# Patient Record
Sex: Female | Born: 1985 | Race: White | Hispanic: No | Marital: Married | Smoking: Never smoker
Health system: Southern US, Community
[De-identification: ages and names within clinical notes are randomized; demographics above are authoritative.]

## PROBLEM LIST (undated history)

## (undated) ENCOUNTER — Inpatient Hospital Stay: Payer: Self-pay

## (undated) DIAGNOSIS — E039 Hypothyroidism, unspecified: Secondary | ICD-10-CM

## (undated) DIAGNOSIS — K8020 Calculus of gallbladder without cholecystitis without obstruction: Secondary | ICD-10-CM

## (undated) DIAGNOSIS — N83202 Unspecified ovarian cyst, left side: Secondary | ICD-10-CM

## (undated) DIAGNOSIS — O039 Complete or unspecified spontaneous abortion without complication: Secondary | ICD-10-CM

## (undated) DIAGNOSIS — F32A Depression, unspecified: Secondary | ICD-10-CM

## (undated) DIAGNOSIS — Z6841 Body Mass Index (BMI) 40.0 and over, adult: Secondary | ICD-10-CM

## (undated) DIAGNOSIS — D649 Anemia, unspecified: Secondary | ICD-10-CM

## (undated) DIAGNOSIS — F419 Anxiety disorder, unspecified: Secondary | ICD-10-CM

## (undated) DIAGNOSIS — B002 Herpesviral gingivostomatitis and pharyngotonsillitis: Secondary | ICD-10-CM

## (undated) DIAGNOSIS — F329 Major depressive disorder, single episode, unspecified: Secondary | ICD-10-CM

## (undated) HISTORY — DX: Herpesviral gingivostomatitis and pharyngotonsillitis: B00.2

## (undated) HISTORY — DX: Unspecified ovarian cyst, left side: N83.202

---

## 2006-04-06 ENCOUNTER — Emergency Department: Payer: Self-pay | Admitting: Emergency Medicine

## 2006-10-10 ENCOUNTER — Emergency Department: Payer: Self-pay | Admitting: Emergency Medicine

## 2008-12-09 ENCOUNTER — Emergency Department: Payer: Self-pay | Admitting: Emergency Medicine

## 2009-01-03 ENCOUNTER — Ambulatory Visit: Payer: Self-pay

## 2009-01-11 ENCOUNTER — Encounter: Payer: Self-pay | Admitting: Family Medicine

## 2009-02-04 ENCOUNTER — Encounter: Payer: Self-pay | Admitting: Family Medicine

## 2009-03-07 ENCOUNTER — Encounter: Payer: Self-pay | Admitting: Family Medicine

## 2009-03-27 ENCOUNTER — Ambulatory Visit: Payer: Self-pay | Admitting: Family Medicine

## 2009-05-04 ENCOUNTER — Encounter: Payer: Self-pay | Admitting: Family Medicine

## 2009-08-22 ENCOUNTER — Emergency Department: Payer: Self-pay | Admitting: Emergency Medicine

## 2010-07-15 ENCOUNTER — Emergency Department: Payer: Self-pay | Admitting: Internal Medicine
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
## 2012-06-06 ENCOUNTER — Ambulatory Visit: Payer: Self-pay | Admitting: Primary Care

## 2013-09-09 DIAGNOSIS — N644 Mastodynia: Secondary | ICD-10-CM | POA: Insufficient documentation

## 2014-06-07 LAB — HM PAP SMEAR: HM Pap smear: NEGATIVE

## 2014-10-28 ENCOUNTER — Emergency Department: Payer: Medicare Other

## 2014-10-28 ENCOUNTER — Encounter: Payer: Self-pay | Admitting: *Deleted

## 2014-10-28 ENCOUNTER — Emergency Department
Admission: EM | Admit: 2014-10-28 | Discharge: 2014-10-28 | Disposition: A | Discharge status: Home / Self Care | Payer: Medicare Other | Attending: Emergency Medicine | Admitting: Emergency Medicine

## 2014-10-28 DIAGNOSIS — O2342 Unspecified infection of urinary tract in pregnancy, second trimester: Secondary | ICD-10-CM | POA: Insufficient documentation

## 2014-10-28 DIAGNOSIS — R109 Unspecified abdominal pain: Secondary | ICD-10-CM

## 2014-10-28 DIAGNOSIS — O9989 Other specified diseases and conditions complicating pregnancy, childbirth and the puerperium: Secondary | ICD-10-CM | POA: Diagnosis present

## 2014-10-28 DIAGNOSIS — Z3A17 17 weeks gestation of pregnancy: Secondary | ICD-10-CM | POA: Insufficient documentation

## 2014-10-28 DIAGNOSIS — Z79899 Other long term (current) drug therapy: Secondary | ICD-10-CM | POA: Insufficient documentation

## 2014-10-28 DIAGNOSIS — O2341 Unspecified infection of urinary tract in pregnancy, first trimester: Secondary | ICD-10-CM

## 2014-10-28 HISTORY — DX: Complete or unspecified spontaneous abortion without complication: O03.9

## 2014-10-28 LAB — URINALYSIS COMPLETE WITH MICROSCOPIC (ARMC ONLY)
BACTERIA UA: NONE SEEN
Bilirubin Urine: NEGATIVE
GLUCOSE, UA: NEGATIVE mg/dL
HGB URINE DIPSTICK: NEGATIVE
Ketones, ur: NEGATIVE mg/dL
Nitrite: NEGATIVE
Protein, ur: NEGATIVE mg/dL
SPECIFIC GRAVITY, URINE: 1.024 (ref 1.005–1.030)
pH: 5 (ref 5.0–8.0)

## 2014-10-28 LAB — BASIC METABOLIC PANEL
ANION GAP: 9 (ref 5–15)
BUN: 7 mg/dL (ref 6–20)
CHLORIDE: 105 mmol/L (ref 101–111)
CO2: 21 mmol/L — AB (ref 22–32)
CREATININE: 0.63 mg/dL (ref 0.44–1.00)
Calcium: 8.9 mg/dL (ref 8.9–10.3)
GFR calc Af Amer: >60 mL/min (ref >60)
GFR calc non Af Amer: >60 mL/min (ref >60)
Glucose, Bld: 106 mg/dL — ABNORMAL HIGH (ref 65–99)
Potassium: 3.5 mmol/L (ref 3.5–5.1)
Sodium: 135 mmol/L (ref 135–145)

## 2014-10-28 LAB — CBC
HCT: 37 % (ref 35.0–47.0)
Hemoglobin: 12 g/dL (ref 12.0–16.0)
MCH: 26.3 pg (ref 26.0–34.0)
MCHC: 32.3 g/dL (ref 32.0–36.0)
MCV: 81.4 fL (ref 80.0–100.0)
PLATELETS: 232 10*3/uL (ref 150–440)
RBC: 4.55 MIL/uL (ref 3.80–5.20)
RDW: 14.6 % — ABNORMAL HIGH (ref 11.5–14.5)
WBC: 14 10*3/uL — AB (ref 3.6–11.0)

## 2014-10-28 MED ORDER — NITROFURANTOIN MONOHYD MACRO 100 MG PO CAPS: 100.0000 mg | ORAL_CAPSULE | Freq: Two times a day (BID) | ORAL | Status: AC

## 2014-10-28 NOTE — Discharge Instructions (Signed)
Your ultrasound shows a pregnancy that is 7 weeks 6 days.  You do have some signs of possible urinary tract infection. Take Macrobid for this. Follow-up with [facility]. Return to the emergency department if you have any urgent concerns.  Abdominal Pain During Pregnancy Belly (abdominal) pain is common during pregnancy. Most of the time, it is not a serious problem. Other times, it can be a sign that something is wrong with the pregnancy. Always tell your doctor if you have belly pain. HOME CARE Monitor your belly pain for any changes. The following actions may help you feel better:  Do not have sex (intercourse) or put anything in your vagina until you feel better.  Rest until your pain stops.  Drink clear fluids if you feel sick to your stomach (nauseous). Do not eat solid food until you feel better.  Only take medicine as told by your doctor.  Keep all doctor visits as told. GET HELP RIGHT AWAY IF:   You are bleeding, leaking fluid, or pieces of tissue come out of your vagina.  You have more pain or cramping.  You keep throwing up (vomiting).  You have pain when you pee (urinate) or have blood in your pee.  You have a fever.  You do not feel your baby moving as much.  You feel very weak or feel like passing out.  You have trouble breathing, with or without belly pain.  You have a very bad headache and belly pain.  You have fluid leaking from your vagina and belly pain.  You keep having watery poop (diarrhea).  Your belly pain does not go away after resting, or the pain gets worse. MAKE SURE YOU:   Understand these instructions.  Will watch your condition.  Will get help right away if you are not doing well or get worse. Document Released: 04/11/2009 Document Revised: 12/24/2012 Document Reviewed: 11/20/2012 [facility] Patient Information 2015 [place], [place]. This information is not intended to replace advice given to you by your health care provider. Make  sure you discuss any questions you have with your health care provider.

## 2014-10-28 NOTE — ED Notes (Signed)
Pt is pregnant LMP 06/28/2014 and EDD 04/04/2015 (pt is about [redacted]weeks pregnant).  Pt has been having cramping today, they feel like menstrual cramps.  No bleeding or vaginal discharge. Pt with hx of miscarriage last year.

## 2014-10-28 NOTE — ED Provider Notes (Signed)
[facility] Emergency Department Provider Note  ____________________________________________  Time seen: 2023  I have reviewed the triage vital signs and the nursing notes.   HISTORY  Chief Complaint Abdominal Cramping  [redacted] weeks pregnant, cramping    HPI Alexis Weiss is a 29 y.o. female  who is G1 P0 A1 and now approximately [redacted] weeks pregnant. At 2:00 today she began to have some cramping in her lower abdomen/pelvic area. This lasted for 15 minutes.She does report that she sneezed after this event and had some pelvic pain 2.  Since then she has been feeling fine without cramping or pain. She has not had any vaginal secretions or fluid and she has not had any vaginal bleeding. She denies any nausea or vomiting. She is concerned about pregnancy due to her first pregnancy ending in miscarriage. That miscarriage was early in pregnancy and when it onset she did not even know she was pregnant.  The patient is receiving prenatal care at [facility] clinic. She has not had an ultrasound to date.    Past Medical History  Diagnosis Date  . Miscarriage     There are no active problems to display for this patient.   History reviewed. No pertinent past surgical history.  Current Outpatient Rx  Name  Route  Sig  Dispense  Refill  . nitrofurantoin, macrocrystal-monohydrate, (MACROBID) 100 MG capsule   Oral   Take 1 capsule (100 mg total) by mouth 2 (two) times daily.   14 capsule   0     Allergies Review of patient's allergies indicates no known allergies.  No family history on file.  Social History History  Substance Use Topics  . Smoking status: Never Smoker   . Smokeless tobacco: Not on file  . Alcohol Use: No    Review of Systems  Constitutional: Negative for fever. ENT: Negative for sore throat. Cardiovascular: Negative for chest pain. Respiratory: Negative for shortness of breath. Gastrointestinal: Negative for abdominal pain, vomiting  and diarrhea. Genitourinary: Pregnant, 17 weeks, see history of present illness. Musculoskeletal: No myalgias or injuries. Skin: Negative for rash. Neurological: Negative for headaches   10-point ROS otherwise negative.  ____________________________________________   PHYSICAL EXAM:  VITAL SIGNS: ED Triage Vitals  Enc Vitals Group     BP 10/28/14 1904 136/81 mmHg     Pulse Rate 10/28/14 1904 94     Resp 10/28/14 1904 22     Temp 10/28/14 1904 98.4 F (36.9 C)     Temp Source 10/28/14 1904 Oral     SpO2 10/28/14 1904 98 %     Weight 10/28/14 1904 247 lb (112.038 kg)     Height 10/28/14 1904 5\' 4"  (1.626 m)     Head Cir --      Peak Flow --      Pain Score 10/28/14 1904 0     Pain Loc --      Pain Edu? --      Excl. in GC? --     Constitutional: Alert and oriented. Well appearing and in no distress. ENT   Head: Normocephalic and atraumatic.   Nose: No congestion/rhinnorhea.   Mouth/Throat: Mucous membranes are moist. Cardiovascular: Normal rate, regular rhythm, no murmur noted Respiratory:  Normal respiratory effort, no tachypnea.    Breath sounds are clear and equal bilaterally.  Gastrointestinal: Soft and nontender. No distention.  Back: No muscle spasm, no tenderness, no CVA tenderness. Musculoskeletal: No deformity noted. Nontender with normal range of motion in  all extremities.  No noted edema. Neurologic:  Normal speech and language. No gross focal neurologic deficits are appreciated.  Skin:  Skin is warm, dry. No rash noted. Psychiatric: Mood and affect are normal. Speech and behavior are normal.  ____________________________________________    LABS (pertinent positives/negatives)  Urinalysis:  White blood cells 6-30, red blood cells negative, no ketones White blood cell count 14,000 Metabolic panel within normal limits. ____________________________________________ ____________________________________________    RADIOLOGY  Bedside ultrasound  performed by me, Janalyn Harder.  Using a portable ultrasound machine we attempted to view of pregnancy that would be consistent with a 17 week chest a patient. There is content in the uterus, but not as developed as one would expect at 17 weeks and without notable fetal heart tones on our limited study. I will order a formal ultrasound due to the lack of confirmation via this limited study.  Pelvic ultrasound OB IMPRESSION: Single live intrauterine pregnancy noted, with a crown-rump length of 1.5 cm, corresponding to a gestational age of [redacted] weeks 6 days. This does not match the gestational age by LMP, reflecting a new estimated date of June 10, 2015.   ____________________________________________   PROCEDURES  Bedside ultrasound: Using a portable ultrasound machine we attempted to view of pregnancy that would be consistent with a 17 week chest a patient. There is content in the uterus, but not as developed as one would expect at 17 weeks and without notable fetal heart tones on our limited study. I will order a formal ultrasound due to the lack of confirmation via this limited study.  ____________________________________________   INITIAL IMPRESSION / ASSESSMENT AND PLAN / ED COURSE  Pertinent labs & imaging results that were available during my care of the patient were reviewed by me and considered in my medical decision making (see chart for details).  This patient appears well. She had very limited cramping that lasted 15 minutes over 6 hours ago. There is no indication for emergent ultrasound. We will confirm fetal heart tones with a bedside ultrasound.  The patient is appreciative this and is here to have a few of the developing pregnancy. She understands that this is a limited study without any comment on details and then it does not replace the significant ultrasound due at 18-19 weeks pregnancy.  ----------------------------------------- 11:01 PM on  10/28/2014 -----------------------------------------  Ultrasound is overall normal but with a gestational age of [redacted] weeks and 6 days not 17 weeks. Or blood cell count is 14,000 otherwise the CBC is reasonable. Metabolic panel is overall within normal limits. Urinalysis shows white blood cells 6-30 with 1+ leukocyte esterase. I will treat her with Macrobid for her possible urinary tract infection.  ____________________________________________   FINAL CLINICAL IMPRESSION(S) / ED DIAGNOSES  Final diagnoses:  UTI (urinary tract infection) in pregnancy in first trimester      Darien Ramus, MD 10/28/14 2305

## 2014-10-29 LAB — HCG, QUANTITATIVE, PREGNANCY: hCG, Beta Chain, Quant, S: 50046 m[IU]/mL — ABNORMAL HIGH (ref <5)

## 2014-11-10 LAB — OB RESULTS CONSOLE GC/CHLAMYDIA
CHLAMYDIA, DNA PROBE: NEGATIVE
GC PROBE AMP, GENITAL: NEGATIVE

## 2014-12-11 ENCOUNTER — Emergency Department
Admission: EM | Admit: 2014-12-11 | Discharge: 2014-12-11 | Disposition: A | Discharge status: Home / Self Care | Payer: Medicare Other | Attending: Emergency Medicine | Admitting: Emergency Medicine

## 2014-12-11 ENCOUNTER — Encounter: Payer: Self-pay | Admitting: Emergency Medicine

## 2014-12-11 DIAGNOSIS — R1031 Right lower quadrant pain: Secondary | ICD-10-CM | POA: Diagnosis not present

## 2014-12-11 DIAGNOSIS — O9989 Other specified diseases and conditions complicating pregnancy, childbirth and the puerperium: Secondary | ICD-10-CM | POA: Insufficient documentation

## 2014-12-11 DIAGNOSIS — R1032 Left lower quadrant pain: Secondary | ICD-10-CM | POA: Diagnosis not present

## 2014-12-11 DIAGNOSIS — O26899 Other specified pregnancy related conditions, unspecified trimester: Secondary | ICD-10-CM

## 2014-12-11 DIAGNOSIS — Z3A14 14 weeks gestation of pregnancy: Secondary | ICD-10-CM | POA: Insufficient documentation

## 2014-12-11 DIAGNOSIS — R1030 Lower abdominal pain, unspecified: Secondary | ICD-10-CM

## 2014-12-11 LAB — COMPREHENSIVE METABOLIC PANEL
ALT: 20 U/L (ref 14–54)
AST: 24 U/L (ref 15–41)
Albumin: 3.2 g/dL — ABNORMAL LOW (ref 3.5–5.0)
Alkaline Phosphatase: 71 U/L (ref 38–126)
Anion gap: 10 (ref 5–15)
BUN: <5 mg/dL — ABNORMAL LOW (ref 6–20)
CALCIUM: 9.8 mg/dL (ref 8.9–10.3)
CO2: 21 mmol/L — AB (ref 22–32)
Chloride: 103 mmol/L (ref 101–111)
Creatinine, Ser: 0.5 mg/dL (ref 0.44–1.00)
GFR calc Af Amer: >60 mL/min (ref >60)
Glucose, Bld: 115 mg/dL — ABNORMAL HIGH (ref 65–99)
Potassium: 3.2 mmol/L — ABNORMAL LOW (ref 3.5–5.1)
Sodium: 134 mmol/L — ABNORMAL LOW (ref 135–145)
Total Bilirubin: 0.2 mg/dL — ABNORMAL LOW (ref 0.3–1.2)
Total Protein: 7.6 g/dL (ref 6.5–8.1)

## 2014-12-11 LAB — URINALYSIS COMPLETE WITH MICROSCOPIC (ARMC ONLY)
Bacteria, UA: NONE SEEN
Bilirubin Urine: NEGATIVE
GLUCOSE, UA: NEGATIVE mg/dL
Hgb urine dipstick: NEGATIVE
Ketones, ur: NEGATIVE mg/dL
LEUKOCYTES UA: NEGATIVE
NITRITE: NEGATIVE
PROTEIN: NEGATIVE mg/dL
Specific Gravity, Urine: 1.008 (ref 1.005–1.030)
pH: 6 (ref 5.0–8.0)

## 2014-12-11 LAB — CBC
HCT: 35.4 % (ref 35.0–47.0)
Hemoglobin: 11.8 g/dL — ABNORMAL LOW (ref 12.0–16.0)
MCH: 26.5 pg (ref 26.0–34.0)
MCHC: 33.4 g/dL (ref 32.0–36.0)
MCV: 79.4 fL — ABNORMAL LOW (ref 80.0–100.0)
PLATELETS: 246 10*3/uL (ref 150–440)
RBC: 4.46 MIL/uL (ref 3.80–5.20)
RDW: 14 % (ref 11.5–14.5)
WBC: 12.6 10*3/uL — ABNORMAL HIGH (ref 3.6–11.0)

## 2014-12-11 LAB — HCG, QUANTITATIVE, PREGNANCY: hCG, Beta Chain, Quant, S: 29807 m[IU]/mL — ABNORMAL HIGH (ref <5)

## 2014-12-11 LAB — LIPASE, BLOOD: LIPASE: 18 U/L — AB (ref 22–51)

## 2014-12-11 NOTE — ED Provider Notes (Signed)
[facility] Emergency Department Provider Note  ____________________________________________  Time seen: 4:30 PM  I have reviewed the triage vital signs and the nursing notes.   HISTORY  Chief Complaint Abdominal Pain    HPI Alexis Weiss is a 29 y.o. female who reports that when she woke up from a nap this afternoon at about 12:45 PM, she had intermittent bilateral lower quadrant abdominal pain. It was sharp and lasting only a few seconds at a time, worse with bending over but no other aggravating or alleviating factors. No fever chills chest pain shortness of breath nausea vomiting or diarrhea. She's had a normal appetite and intake. No dysuria frequency urgency hematuria. No vaginal bleeding leakage of fluid cramps.  She is [redacted] weeks pregnant and gets her prenatal care at [facility]. She is artery had an initial ultrasound which was unremarkable and has a follow-up with them on August 20.     Past Medical History  Diagnosis Date  . Miscarriage     There are no active problems to display for this patient.   History reviewed. No pertinent past surgical history.  No current outpatient prescriptions on file. Likely just Prenatal vitamins Allergies Review of patient's allergies indicates no known allergies.  History reviewed. No pertinent family history.  Social History History  Substance Use Topics  . Smoking status: Never Smoker   . Smokeless tobacco: Not on file  . Alcohol Use: No    Review of Systems  Constitutional: No fever or chills. No weight changes Eyes:No blurry vision or double vision.  ENT: No sore throat. Cardiovascular: No chest pain. Respiratory: No dyspnea or cough. Gastrointestinal: Abdominal pain as above without vomiting and diarrhea.  No BRBPR or melena. Genitourinary: Negative for dysuria, urinary retention, bloody urine, or difficulty urinating. Musculoskeletal: Negative for back pain. No joint swelling or  pain. Skin: Negative for rash. Neurological: Negative for headaches, focal weakness or numbness. Psychiatric:No anxiety or depression.   Endocrine:No hot/cold intolerance, changes in energy, or sleep difficulty.  10-point ROS otherwise negative.  ____________________________________________   PHYSICAL EXAM:  VITAL SIGNS: ED Triage Vitals  Enc Vitals Group     BP 12/11/14 1542 115/83 mmHg     Pulse Rate 12/11/14 1542 93     Resp --      Temp 12/11/14 1542 98.6 F (37 C)     Temp Source 12/11/14 1542 Oral     SpO2 12/11/14 1542 97 %     Weight 12/11/14 1542 242 lb (109.77 kg)     Height 12/11/14 1542  (1.626 m)     Head Cir --      Peak Flow --      Pain Score 12/11/14 1542 6     Pain Loc --      Pain Edu? --      Excl. in GC? --      Constitutional: Alert and oriented. Well appearing and in no distress. Eyes: No scleral icterus. No conjunctival pallor. PERRL. EOMI ENT   Head: Normocephalic and atraumatic.   Nose: No congestion/rhinnorhea. No septal hematoma   Mouth/Throat: MMM, no pharyngeal erythema. No peritonsillar mass. No uvula shift.   Neck: No stridor. No SubQ emphysema. No meningismus. Hematological/Lymphatic/Immunilogical: No cervical lymphadenopathy. Cardiovascular: RRR. Normal and symmetric distal pulses are present in all extremities. No murmurs, rubs, or gallops. Respiratory: Normal respiratory effort without tachypnea nor retractions. Breath sounds are clear and equal bilaterally. No wheezes/rales/rhonchi. Gastrointestinal: Diffuse mild tenderness in bilateral lower quadrants  and suprapubic area. No distention. There is no CVA tenderness.  No rebound, rigidity, or guarding. Genitourinary: deferred Musculoskeletal: Nontender with normal range of motion in all extremities. No joint effusions.  No lower extremity tenderness.  No edema. Neurologic:   Normal speech and language.  CN 2-10 normal. Motor grossly intact. No pronator drift.   Normal gait. No gross focal neurologic deficits are appreciated.  Skin:  Skin is warm, dry and intact. No rash noted.  No petechiae, purpura, or bullae. Psychiatric: Mood and affect are normal. Speech and behavior are normal. Patient exhibits appropriate insight and judgment.  ____________________________________________    LABS (pertinent positives/negatives) (all labs ordered are listed, but only abnormal results are displayed) Labs Reviewed  LIPASE, BLOOD - Abnormal; Notable for the following:    Lipase 18 (*)    All other components within normal limits  COMPREHENSIVE METABOLIC PANEL - Abnormal; Notable for the following:    Sodium 134 (*)    Potassium 3.2 (*)    CO2 21 (*)    Glucose, Bld 115 (*)    BUN <5 (*)    Albumin 3.2 (*)    Total Bilirubin 0.2 (*)    All other components within normal limits  CBC - Abnormal; Notable for the following:    WBC 12.6 (*)    Hemoglobin 11.8 (*)    MCV 79.4 (*)    All other components within normal limits  URINALYSIS COMPLETEWITH MICROSCOPIC ([facility] ONLY) - Abnormal; Notable for the following:    Color, Urine YELLOW (*)    APPearance CLEAR (*)    Squamous Epithelial / LPF 0-5 (*)    All other components within normal limits  HCG, QUANTITATIVE, PREGNANCY   ____________________________________________   EKG    ____________________________________________    RADIOLOGY    ____________________________________________   PROCEDURES  ____________________________________________   INITIAL IMPRESSION / ASSESSMENT AND PLAN / ED COURSE  Pertinent labs & imaging results that were available during my care of the patient were reviewed by me and considered in my medical decision making (see chart for details).  Patient in second trimester presents with bilateral lower quadrant abdominal pain. Low suspicion for PID STI UTI pyelonephritis TOA torsion. This appears to be ligamentous pain related to growing pregnancy. Labs and  urinalysis are unremarkable. We'll discharge home and have her follow-up with obstetrics.  ____________________________________________   FINAL CLINICAL IMPRESSION(S) / ED DIAGNOSES  Final diagnoses:  Pregnancy related abdominal pain of lower quadrant, antepartum      Sharman Cheek, MD 12/11/14 1645

## 2014-12-11 NOTE — ED Notes (Signed)
Patient to ED with c/o bilateral lower abdominal pain since around noon, patient reports she is approx [redacted] weeks pregnant. Denies urinary symptoms.

## 2014-12-11 NOTE — Discharge Instructions (Signed)

## 2015-01-16 ENCOUNTER — Emergency Department: Payer: Medicare Other

## 2015-01-16 ENCOUNTER — Emergency Department
Admission: EM | Admit: 2015-01-16 | Discharge: 2015-01-16 | Disposition: A | Discharge status: Home / Self Care | Payer: Medicare Other | Attending: Emergency Medicine | Admitting: Emergency Medicine

## 2015-01-16 DIAGNOSIS — O9989 Other specified diseases and conditions complicating pregnancy, childbirth and the puerperium: Secondary | ICD-10-CM | POA: Insufficient documentation

## 2015-01-16 DIAGNOSIS — R109 Unspecified abdominal pain: Secondary | ICD-10-CM

## 2015-01-16 DIAGNOSIS — R1030 Lower abdominal pain, unspecified: Secondary | ICD-10-CM | POA: Diagnosis not present

## 2015-01-16 DIAGNOSIS — Z3A20 20 weeks gestation of pregnancy: Secondary | ICD-10-CM | POA: Diagnosis not present

## 2015-01-16 DIAGNOSIS — O26899 Other specified pregnancy related conditions, unspecified trimester: Secondary | ICD-10-CM

## 2015-01-16 LAB — URINALYSIS COMPLETE WITH MICROSCOPIC (ARMC ONLY)
Bilirubin Urine: NEGATIVE
Glucose, UA: 50 mg/dL — AB
Hgb urine dipstick: NEGATIVE
Ketones, ur: NEGATIVE mg/dL
Leukocytes, UA: NEGATIVE
Nitrite: NEGATIVE
Protein, ur: NEGATIVE mg/dL
Specific Gravity, Urine: 1.02 (ref 1.005–1.030)
pH: 5 (ref 5.0–8.0)

## 2015-01-16 LAB — CBC
HCT: 33.1 % — ABNORMAL LOW (ref 35.0–47.0)
HEMOGLOBIN: 11 g/dL — AB (ref 12.0–16.0)
MCH: 25.7 pg — AB (ref 26.0–34.0)
MCHC: 33.1 g/dL (ref 32.0–36.0)
MCV: 77.7 fL — ABNORMAL LOW (ref 80.0–100.0)
PLATELETS: 294 10*3/uL (ref 150–440)
RBC: 4.26 MIL/uL (ref 3.80–5.20)
RDW: 13.7 % (ref 11.5–14.5)
WBC: 13.3 10*3/uL — ABNORMAL HIGH (ref 3.6–11.0)

## 2015-01-16 LAB — HCG, QUANTITATIVE, PREGNANCY: HCG, BETA CHAIN, QUANT, S: 9295 m[IU]/mL — AB (ref <5)

## 2015-01-16 NOTE — ED Notes (Addendum)
Pt has low abd pain for 3 days   No vag bleeding no dysuria. No vag d/c.  Pregnant approx 19 weeks.  Treated at [facility].  [id number]

## 2015-01-16 NOTE — ED Notes (Signed)
Patient reports being approximately [redacted] weeks pregnant and having abdominal cramping for 3 days.  Patient denies vaginal bleeding.

## 2015-01-16 NOTE — ED Provider Notes (Signed)
[facility] Emergency Department Provider Note  ____________________________________________  Time seen: Approximately 11 PM  I have reviewed the triage vital signs and the nursing notes.   HISTORY  Chief Complaint Abdominal Cramping    HPI Alexis Weiss is a 29 y.o. female who is a G2 P0 with a history of one miscarriage at 2-1/2 months who is presenting tonight with abdominal cramping over the last 3 days. She says that the pain is greatest suprapubic but is radiating in from the bilateral sides of the abdomen. She says that the cramping is to the lower abdomen and intermittent. She denies any vaginal discharge, bleeding or dysuria. She says that she is already been checked for sexually transmitted diseases this pregnancy which has been negative. She has had the same sexual partner throughout.  She is compliant with her prenatal vitamins and has follow-up with her OB/GYN this Tuesday at [facility] side OB/GYN. Patient is concerned for miscarriage because of previous miscarriage   Past Medical History  Diagnosis Date  . Miscarriage     There are no active problems to display for this patient.   No past surgical history on file.  No current outpatient prescriptions on file.  Allergies Review of patient's allergies indicates no known allergies.  No family history on file.  Social History Social History  Substance Use Topics  . Smoking status: Never Smoker   . Smokeless tobacco: Not on file  . Alcohol Use: No    Review of Systems Constitutional: No fever/chills Eyes: No visual changes. ENT: No sore throat. Cardiovascular: Denies chest pain. Respiratory: Denies shortness of breath. Gastrointestinal:  No nausea, no vomiting.  No diarrhea.  No constipation. Genitourinary: Negative for dysuria. Musculoskeletal: Negative for back pain. Skin: Negative for rash. Neurological: Negative for headaches, focal weakness or numbness.  10-point ROS  otherwise negative.  ____________________________________________   PHYSICAL EXAM:  VITAL SIGNS: ED Triage Vitals  Enc Vitals Group     BP 01/16/15 1921 112/70 mmHg     Pulse Rate 01/16/15 1921 96     Resp 01/16/15 1921 20     Temp 01/16/15 1921 98.5 F (36.9 C)     Temp Source 01/16/15 1921 Oral     SpO2 01/16/15 1921 96 %     Weight 01/16/15 1921 246 lb (111.585 kg)     Height 01/16/15 1921  (1.651 m)     Head Cir --      Peak Flow --      Pain Score 01/16/15 1922 8     Pain Loc --      Pain Edu? --      Excl. in GC? --     Constitutional: Alert and oriented. Well appearing and in no acute distress. Eyes: Conjunctivae are normal. PERRL. EOMI. Head: Atraumatic. Nose: No congestion/rhinnorhea. Mouth/Throat: Mucous membranes are moist.  Oropharynx non-erythematous. Neck: No stridor.   Cardiovascular: Normal rate, regular rhythm. Grossly normal heart sounds.  Good peripheral circulation. Respiratory: Normal respiratory effort.  No retractions. Lungs CTAB. Gastrointestinal: Soft with very mild tenderness palpation to the suprapubic region. No tenderness over McBurney's point. No distention. No abdominal bruits. No CVA tenderness. Genitourinary: Normal external exam. Bimanual exam with closed cervix. Musculoskeletal: No lower extremity tenderness nor edema.  No joint effusions. Neurologic:  Normal speech and language. No gross focal neurologic deficits are appreciated. No gait instability. Skin:  Skin is warm, dry and intact. No rash noted. Psychiatric: Mood and affect are normal. Speech and behavior  are normal.  ____________________________________________   LABS (all labs ordered are listed, but only abnormal results are displayed)  Labs Reviewed  CBC - Abnormal; Notable for the following:    WBC 13.3 (*)    Hemoglobin 11.0 (*)    HCT 33.1 (*)    MCV 77.7 (*)    MCH 25.7 (*)    All other components within normal limits  HCG, QUANTITATIVE, PREGNANCY -  Abnormal; Notable for the following:    hCG, Beta Chain, Quant, S 9295 (*)    All other components within normal limits  URINALYSIS COMPLETEWITH MICROSCOPIC ([facility] ONLY) - Abnormal; Notable for the following:    Color, Urine YELLOW (*)    APPearance CLOUDY (*)    Glucose, UA 50 (*)    Bacteria, UA RARE (*)    Squamous Epithelial / LPF 6-30 (*)    All other components within normal limits   ____________________________________________  EKG   ____________________________________________  RADIOLOGY  Ultrasound with single live IUP at 19 weeks and 6 days. No abnormalities seen. ____________________________________________   PROCEDURES    ____________________________________________   INITIAL IMPRESSION / ASSESSMENT AND PLAN / ED COURSE  Pertinent labs & imaging results that were available during my care of the patient were reviewed by me and considered in my medical decision making (see chart for details).  Patient with chronically elevated WBC count. HCG is appropriate for this time in the pregnancy.  Patient with follow up this Tuesday at her OB/GYN's office. We'll continue prenatal vitamins. We'll discharge to home. Discussed lab as well as ultrasound findings with the patient and her mother. Suspect that this pain may be related to round ligament pain. ____________________________________________   FINAL CLINICAL IMPRESSION(S) / ED DIAGNOSES  Lower abdominal pain in the second trimester pregnancy. Initial visit.    Myrna Blazer, MD 01/16/15 [phone]

## 2015-01-16 NOTE — Discharge Instructions (Signed)

## 2015-01-18 LAB — OB RESULTS CONSOLE HEPATITIS B SURFACE ANTIGEN: Hepatitis B Surface Ag: NEGATIVE

## 2015-01-18 LAB — OB RESULTS CONSOLE RUBELLA ANTIBODY, IGM: Rubella: IMMUNE

## 2015-01-18 LAB — OB RESULTS CONSOLE ABO/RH: RH Type: POSITIVE

## 2015-01-18 LAB — OB RESULTS CONSOLE HIV ANTIBODY (ROUTINE TESTING): HIV: NONREACTIVE

## 2015-01-18 LAB — OB RESULTS CONSOLE VARICELLA ZOSTER ANTIBODY, IGG: Varicella: IMMUNE

## 2015-01-18 LAB — OB RESULTS CONSOLE ANTIBODY SCREEN: ANTIBODY SCREEN: NEGATIVE

## 2015-01-18 LAB — OB RESULTS CONSOLE RPR: RPR: NONREACTIVE

## 2015-03-03 ENCOUNTER — Emergency Department
Admission: EM | Admit: 2015-03-03 | Discharge: 2015-03-03 | Disposition: A | Discharge status: Home / Self Care | Payer: Medicare Other | Attending: Emergency Medicine | Admitting: Emergency Medicine

## 2015-03-03 ENCOUNTER — Encounter: Payer: Self-pay | Admitting: Emergency Medicine

## 2015-03-03 DIAGNOSIS — K047 Periapical abscess without sinus: Secondary | ICD-10-CM

## 2015-03-03 DIAGNOSIS — Z3A27 27 weeks gestation of pregnancy: Secondary | ICD-10-CM | POA: Diagnosis not present

## 2015-03-03 DIAGNOSIS — K0381 Cracked tooth: Secondary | ICD-10-CM | POA: Insufficient documentation

## 2015-03-03 DIAGNOSIS — O99612 Diseases of the digestive system complicating pregnancy, second trimester: Secondary | ICD-10-CM | POA: Diagnosis not present

## 2015-03-03 DIAGNOSIS — S025XXA Fracture of tooth (traumatic), initial encounter for closed fracture: Secondary | ICD-10-CM

## 2015-03-03 HISTORY — DX: Hypothyroidism, unspecified: E03.9

## 2015-03-03 MED ORDER — MAGIC MOUTHWASH W/LIDOCAINE: 5.0000 mL | Freq: Four times a day (QID) | ORAL | Status: DC

## 2015-03-03 MED ORDER — AMOXICILLIN 875 MG PO TABS: 875.0000 mg | ORAL_TABLET | Freq: Two times a day (BID) | ORAL | Status: DC

## 2015-03-03 NOTE — ED Notes (Signed)
Pt presents to the ER from home with complaints of toothache for about 3 weeks, pt reports she does not have a dentist. Pt is [redacted] weeks pregnant. Pt denies taking any medication for discomfort. Pt talks in complete sentences no respiratory distress noted.

## 2015-03-03 NOTE — Discharge Instructions (Signed)
Dental Abscess °A dental abscess is a collection of pus in or around a tooth. °CAUSES °This condition is caused by a bacterial infection around the root of the tooth that involves the inner part of the tooth (pulp). It may result from: °· Severe tooth decay. °· Trauma to the tooth that allows bacteria to enter into the pulp, such as a broken or chipped tooth. °· Severe gum disease around a tooth. °SYMPTOMS °Symptoms of this condition include: °· Severe pain in and around the infected tooth. °· Swelling and redness around the infected tooth, in the mouth, or in the face. °· Tenderness. °· Pus drainage. °· Bad breath. °· Bitter taste in the mouth. °· Difficulty swallowing. °· Difficulty opening the mouth. °· Nausea. °· Vomiting. °· Chills. °· Swollen neck glands. °· Fever. °DIAGNOSIS °This condition is diagnosed with examination of the infected tooth. During the exam, your dentist may tap on the infected tooth. Your dentist will also ask about your medical and dental history and may order X-rays. °TREATMENT °This condition is treated by eliminating the infection. This may be done with: °· Antibiotic medicine. °· A root canal. This may be performed to save the tooth. °· Pulling (extracting) the tooth. This may also involve draining the abscess. This is done if the tooth cannot be saved. °HOME CARE INSTRUCTIONS °· Take medicines only as directed by your dentist. °· If you were prescribed antibiotic medicine, finish all of it even if you start to feel better. °· Rinse your mouth (gargle) often with salt water to relieve pain or swelling. °· Do not drive or operate heavy machinery while taking pain medicine. °· Do not apply heat to the outside of your mouth. °· Keep all follow-up visits as directed by your dentist. This is important. °SEEK MEDICAL CARE IF: °· Your pain is worse and is not helped by medicine. °SEEK IMMEDIATE MEDICAL CARE IF: °· You have a fever or chills. °· Your symptoms suddenly get worse. °· You have a  very bad headache. °· You have problems breathing or swallowing. °· You have trouble opening your mouth. °· You have swelling in your neck or around your eye. °  °This information is not intended to replace advice given to you by your health care provider. Make sure you discuss any questions you have with your health care provider. °  °Document Released: 04/23/2005 Document Revised: 09/07/2014 Document Reviewed: 04/20/2014 °Elsevier Interactive Patient Education ©2016 Elsevier Inc. ° °Dental Pain °Dental pain may be caused by many things, including: °· Tooth decay (cavities or caries). Cavities expose the nerve of your tooth to air and hot or cold temperatures. This can cause pain or discomfort. °· Abscess or infection. A dental abscess is a collection of infected pus from a bacterial infection in the inner part of the tooth (pulp). It usually occurs at the end of the tooth's root. °· Injury. °· An unknown reason (idiopathic). °Your pain may be mild or severe. It may only occur when: °· You are chewing. °· You are exposed to hot or cold temperature. °· You are eating or drinking sugary foods or beverages, such as soda or candy. °Your pain may also be constant. °HOME CARE INSTRUCTIONS °Watch your dental pain for any changes. The following actions may help to lessen any discomfort that you are feeling: °· Take medicines only as directed by your dentist. °· If you were prescribed an antibiotic medicine, finish all of it even if you start to feel better. °· Keep all   follow-up visits as directed by your dentist. This is important.  Do not apply heat to the outside of your face.  Rinse your mouth or gargle with salt water if directed by your dentist. This helps with pain and swelling.  You can make salt water by adding  tsp of salt to 1 cup of warm water.  Apply ice to the painful area of your face:  Put ice in a plastic bag.  Place a towel between your skin and the bag.  Leave the ice on for 20 minutes,  2-3 times per day.  Avoid foods or drinks that cause you pain, such as:  Very hot or very cold foods or drinks.  Sweet or sugary foods or drinks. SEEK MEDICAL CARE IF:  Your pain is not controlled with medicines.  Your symptoms are worse.  You have new symptoms. SEEK IMMEDIATE MEDICAL CARE IF:  You are unable to open your mouth.  You are having trouble breathing or swallowing.  You have a fever.  Your face, neck, or jaw is swollen.   This information is not intended to replace advice given to you by your health care provider. Make sure you discuss any questions you have with your health care provider.   Document Released: 04/23/2005 Document Revised: 09/07/2014 Document Reviewed: 04/19/2014 Elsevier Interactive Patient Education 2016 Elsevier Inc.  Tooth Injuries Tooth injuries (tooth trauma) include cracked or broken teeth (fractures), teeth that have been moved out of place or dislodged (luxations), and knocked-out teeth (avulsions). A tooth injury often needs to be treated quickly to save the tooth. However, sometimes it is not possible to save a tooth after an injury, so the tooth may need to be removed (extracted). CAUSES Tooth injuries may be caused by any force that is strong enough to chip, break, dislodge, or knock out a tooth. Forces may be due to:  Sports injuries.  Falls.  Accidents.  Fights. RISK FACTORS You may be more likely to injure a tooth if you play a contact sport without using a mouthguard. SYMPTOMS A tooth that is forced into the gum may appear dislodged or moved out of position into the tooth socket. A fractured tooth may not be as obvious. Symptoms of a tooth injury include:  Pain, especially with chewing.  A loose tooth.  Bleeding in or around the tooth.  Swelling or bruising near the tooth.  Swelling or bruising of the lip over the injured tooth.  Increased sensitivity to heat and cold. DIAGNOSIS A tooth injury can be diagnosed  with a medical history and a physical exam. You may also need dental X-rays to check for injuries to the root of the tooth. TREATMENT Treatment depends on the type of injury you have and how bad it is. Treatment may need to be done quickly to save your tooth. Possible treatments include:  Replacing a tooth fragment with a filling, cap, or hard, protective cover (crown). This may be an option for a chip or fracture that does not involve the inside of your tooth (pulp).  Having a procedure to repair the inside of the tooth (root canal) and then having a crown placed on top. This may be done to treat a tooth fracture that involves the pulp.  Repositioning a dislodged tooth, then doing a root canal. The root canal usually needs to be done within a few days of the injury.  Replacing a knocked-out tooth in the socket, if possible, then doing a root canal a few weeks later.  Tooth extraction for a fracture that extends below your gumline or splits your tooth completely. HOME CARE INSTRUCTIONS  Take medicines only as directed by your dental provider or health care provider.  Keep all follow-up visits as directed by your dental provider or health care provider. This is important.  Do not eat or chew on very hard objects. These include ice cubes, pens, pencils, hard candy, and popcorn kernels.  Do not clench or grind your teeth. Tell your dental provider or health care provider if you grind your teeth while you sleep.  Apply ice to your mouth near the injured tooth as directed by your dental provider or health care provider.  Follow instructions about rinsing your mouth with salt water as directed by your dental provider or health care provider.  Do not use your teeth to open packages.  Always wear mouth protection when you play contact sports. SEEK MEDICAL CARE IF:  You continue to have tooth pain after a tooth injury.  Your tooth is sensitive to heat and cold.  You develop swelling near  your injured tooth.  You have a fever.  You are unable to open your jaw.  You are drooling and it is getting worse.   This information is not intended to replace advice given to you by your health care provider. Make sure you discuss any questions you have with your health care provider.   Document Released: 01/19/2004 Document Revised: 09/07/2014 Document Reviewed: 04/19/2014 Elsevier Interactive Patient Education [employer]2016 Elsevier Inc.

## 2015-03-03 NOTE — ED Provider Notes (Signed)
[facility] Emergency Department Provider Note  ____________________________________________  Time seen: Approximately 10:38 PM  I have reviewed the triage vital signs and the nursing notes.   HISTORY  Chief Complaint Dental Pain    HPI Alexis Weiss is a 29 y.o. female who presents to the emergency department for a "broken tooth." She states that Persisted for the past 3 weeks. She states any time that she eats, or take something out of her food she sees "bits and pieces of my tooth." She states that the pain is severe. She denies any fevers or chills, difficulty swallowing or breathing, nausea or vomiting. She has not taken anything including Tylenol for symptomatically control. She is [redacted] weeks pregnant. She states that she does not have a Education officer, community.   Past Medical History  Diagnosis Date  . Miscarriage   . Hypothyroid     There are no active problems to display for this patient.   History reviewed. No pertinent past surgical history.  Current Outpatient Rx  Name  Route  Sig  Dispense  Refill  . amoxicillin (AMOXIL) 875 MG tablet   Oral   Take 1 tablet (875 mg total) by mouth 2 (two) times daily.   14 tablet   0   . magic mouthwash w/lidocaine SOLN   Oral   Take 5 mLs by mouth 4 (four) times daily.   240 mL   0     Dispense in 1/1/1/1 ratio     Allergies Review of patient's allergies indicates no known allergies.  No family history on file.  Social History Social History  Substance Use Topics  . Smoking status: Never Smoker   . Smokeless tobacco: None  . Alcohol Use: No    Review of Systems Constitutional: No fever/chills Eyes: No visual changes. ENT: No sore throat. Endorses right lower jaw tooth pain. Cardiovascular: Denies chest pain. Respiratory: Denies shortness of breath. Gastrointestinal: No abdominal pain.  No nausea, no vomiting.  No diarrhea.  No constipation. Genitourinary: Negative for  dysuria. Musculoskeletal: Negative for back pain. Skin: Negative for rash. Neurological: Negative for headaches, focal weakness or numbness.  10-point ROS otherwise negative.  ____________________________________________   PHYSICAL EXAM:  VITAL SIGNS: ED Triage Vitals  Enc Vitals Group     BP 03/03/15 2227 126/93 mmHg     Pulse Rate 03/03/15 2227 91     Resp 03/03/15 2227 20     Temp 03/03/15 2227 98 F (36.7 C)     Temp Source 03/03/15 2227 Oral     SpO2 03/03/15 2227 99 %     Weight 03/03/15 2227 248 lb (112.492 kg)     Height 03/03/15 2227  (1.626 m)     Head Cir --      Peak Flow --      Pain Score 03/03/15 2228 10     Pain Loc --      Pain Edu? --      Excl. in GC? --     Constitutional: Alert and oriented. Well appearing and in no acute distress. Eyes: Conjunctivae are normal. PERRL. EOMI. Head: Atraumatic. Nose: No congestion/rhinnorhea. Mouth/Throat: Mucous membranes are moist.  Oropharynx non-erythematous. Fractured tooth is observed on the first molar right lower jaw. Surrounding erythema and edema. No fluctuance noted when palpated with tongue depressor. No pus noted. Neck: No stridor.   Cardiovascular: Normal rate, regular rhythm. Grossly normal heart sounds.  Good peripheral circulation. Respiratory: Normal respiratory effort.  No retractions. Lungs CTAB.  Gastrointestinal: Soft and nontender. No distention. No abdominal bruits. No CVA tenderness. Musculoskeletal: No lower extremity tenderness nor edema.  No joint effusions. Neurologic:  Normal speech and language. No gross focal neurologic deficits are appreciated. No gait instability. Skin:  Skin is warm, dry and intact. No rash noted. Psychiatric: Mood and affect are normal. Speech and behavior are normal.  ____________________________________________   LABS (all labs ordered are listed, but only abnormal results are displayed)  Labs Reviewed - No data to  display ____________________________________________  EKG   ____________________________________________  RADIOLOGY   ____________________________________________   PROCEDURES  Procedure(s) performed: None  Critical Care performed: No  ____________________________________________   INITIAL IMPRESSION / ASSESSMENT AND PLAN / ED COURSE  Pertinent labs & imaging results that were available during my care of the patient were reviewed by me and considered in my medical decision making (see chart for details).  The patient is a 29 year old female who is 27 weeks gravid complaining of right lower jaw tooth pain. She is physical exam, symptoms, history are consistent with dental fracture with surrounding infection. Advised the patient of findings and diagnosis. Advised patient to place her on amoxicillin and magic mouthwash for symptomatic control. [facility] Patient verbalizes understanding of treatment plan and verbalizes compliance with same. ____________________________________________   FINAL CLINICAL IMPRESSION(S) / ED DIAGNOSES  Final diagnoses:  Broken tooth, closed, initial encounter  Dental infection      Racheal PatchesJonathan D Cuthriell, PA-C 03/03/15 2253  Loleta Roseory Forbach, MD 03/03/15 2358

## 2015-04-03 ENCOUNTER — Inpatient Hospital Stay
Admission: EM | Admit: 2015-04-03 | Discharge: 2015-04-08 | DRG: 781 | Disposition: A | Discharge status: Home / Self Care | Payer: Medicare Other | Attending: Obstetrics & Gynecology | Admitting: Obstetrics & Gynecology

## 2015-04-03 ENCOUNTER — Encounter: Payer: Self-pay | Admitting: *Deleted

## 2015-04-03 DIAGNOSIS — O43119 Circumvallate placenta, unspecified trimester: Secondary | ICD-10-CM | POA: Diagnosis present

## 2015-04-03 DIAGNOSIS — O1494 Unspecified pre-eclampsia, complicating childbirth: Secondary | ICD-10-CM | POA: Diagnosis present

## 2015-04-03 DIAGNOSIS — O99343 Other mental disorders complicating pregnancy, third trimester: Secondary | ICD-10-CM | POA: Diagnosis present

## 2015-04-03 DIAGNOSIS — F419 Anxiety disorder, unspecified: Secondary | ICD-10-CM | POA: Diagnosis present

## 2015-04-03 DIAGNOSIS — O99213 Obesity complicating pregnancy, third trimester: Secondary | ICD-10-CM | POA: Diagnosis present

## 2015-04-03 DIAGNOSIS — O98513 Other viral diseases complicating pregnancy, third trimester: Secondary | ICD-10-CM | POA: Diagnosis present

## 2015-04-03 DIAGNOSIS — Z3490 Encounter for supervision of normal pregnancy, unspecified, unspecified trimester: Secondary | ICD-10-CM

## 2015-04-03 DIAGNOSIS — B009 Herpesviral infection, unspecified: Secondary | ICD-10-CM | POA: Diagnosis not present

## 2015-04-03 DIAGNOSIS — Z6841 Body Mass Index (BMI) 40.0 and over, adult: Secondary | ICD-10-CM

## 2015-04-03 DIAGNOSIS — R609 Edema, unspecified: Secondary | ICD-10-CM | POA: Diagnosis present

## 2015-04-03 DIAGNOSIS — Z79899 Other long term (current) drug therapy: Secondary | ICD-10-CM

## 2015-04-03 DIAGNOSIS — E039 Hypothyroidism, unspecified: Secondary | ICD-10-CM | POA: Diagnosis present

## 2015-04-03 DIAGNOSIS — D649 Anemia, unspecified: Secondary | ICD-10-CM | POA: Diagnosis present

## 2015-04-03 DIAGNOSIS — O1403 Mild to moderate pre-eclampsia, third trimester: Principal | ICD-10-CM | POA: Diagnosis present

## 2015-04-03 DIAGNOSIS — F329 Major depressive disorder, single episode, unspecified: Secondary | ICD-10-CM | POA: Diagnosis present

## 2015-04-03 DIAGNOSIS — Z3A30 30 weeks gestation of pregnancy: Secondary | ICD-10-CM

## 2015-04-03 DIAGNOSIS — O169 Unspecified maternal hypertension, unspecified trimester: Secondary | ICD-10-CM

## 2015-04-03 DIAGNOSIS — O99013 Anemia complicating pregnancy, third trimester: Secondary | ICD-10-CM | POA: Diagnosis present

## 2015-04-03 DIAGNOSIS — O3663X0 Maternal care for excessive fetal growth, third trimester, not applicable or unspecified: Secondary | ICD-10-CM | POA: Diagnosis present

## 2015-04-03 HISTORY — DX: Anxiety disorder, unspecified: F41.9

## 2015-04-03 HISTORY — DX: Major depressive disorder, single episode, unspecified: F32.9

## 2015-04-03 HISTORY — DX: Morbid (severe) obesity due to excess calories: E66.01

## 2015-04-03 HISTORY — DX: Anemia, unspecified: D64.9

## 2015-04-03 HISTORY — DX: Depression, unspecified: F32.A

## 2015-04-03 HISTORY — DX: Body Mass Index (BMI) 40.0 and over, adult: Z684

## 2015-04-03 LAB — CBC
HCT: 28.3 % — ABNORMAL LOW (ref 35.0–47.0)
Hemoglobin: 8.9 g/dL — ABNORMAL LOW (ref 12.0–16.0)
MCH: 21.4 pg — AB (ref 26.0–34.0)
MCHC: 31.6 g/dL — AB (ref 32.0–36.0)
MCV: 67.5 fL — AB (ref 80.0–100.0)
PLATELETS: 238 10*3/uL (ref 150–440)
RBC: 4.19 MIL/uL (ref 3.80–5.20)
RDW: 17.4 % — AB (ref 11.5–14.5)
WBC: 13.3 10*3/uL — AB (ref 3.6–11.0)

## 2015-04-03 LAB — COMPREHENSIVE METABOLIC PANEL
ALT: 12 U/L — AB (ref 14–54)
AST: 18 U/L (ref 15–41)
Albumin: 2 g/dL — ABNORMAL LOW (ref 3.5–5.0)
Alkaline Phosphatase: 132 U/L — ABNORMAL HIGH (ref 38–126)
Anion gap: 7 (ref 5–15)
BILIRUBIN TOTAL: 0.2 mg/dL — AB (ref 0.3–1.2)
BUN: 16 mg/dL (ref 6–20)
CHLORIDE: 102 mmol/L (ref 101–111)
CO2: 24 mmol/L (ref 22–32)
CREATININE: 0.86 mg/dL (ref 0.44–1.00)
Calcium: 9 mg/dL (ref 8.9–10.3)
GFR calc Af Amer: >60 mL/min (ref >60)
GLUCOSE: 140 mg/dL — AB (ref 65–99)
Potassium: 4.2 mmol/L (ref 3.5–5.1)
Sodium: 133 mmol/L — ABNORMAL LOW (ref 135–145)
Total Protein: 6.2 g/dL — ABNORMAL LOW (ref 6.5–8.1)

## 2015-04-03 NOTE — OB Triage Note (Signed)
Patient presents with c/o contractions since 1800 after washing her car outside.  Denies any bleeding/spotting.  No s/s srom reported.  Appears in no apparent distress, abdomen soft, non-tender.  efm and toco applied.  Active fetus noted.  Clean catch urine collected, clear pale yellow appearance.  Provider notified of patient arrival.

## 2015-04-03 NOTE — OB Triage Note (Addendum)
Pain episodes continued, pt called CNM Colleen after eatting dinner- as episodes continued as described. Instructed to drink 2 glasses of water, ( pt did-approx 20 oz each) and rest. Pt stated no change in activity- so pt's mother in law called hospital. Pt states baaby has been active today. No c/o's re nausea or vomiting, no issues with voiding . Pt reports regular bm.

## 2015-04-04 ENCOUNTER — Observation Stay: Payer: Medicare Other

## 2015-04-04 ENCOUNTER — Encounter: Payer: Self-pay | Admitting: Obstetrics & Gynecology

## 2015-04-04 DIAGNOSIS — E039 Hypothyroidism, unspecified: Secondary | ICD-10-CM | POA: Diagnosis present

## 2015-04-04 DIAGNOSIS — O3663X0 Maternal care for excessive fetal growth, third trimester, not applicable or unspecified: Secondary | ICD-10-CM | POA: Diagnosis present

## 2015-04-04 DIAGNOSIS — F419 Anxiety disorder, unspecified: Secondary | ICD-10-CM | POA: Diagnosis present

## 2015-04-04 DIAGNOSIS — F329 Major depressive disorder, single episode, unspecified: Secondary | ICD-10-CM | POA: Diagnosis present

## 2015-04-04 DIAGNOSIS — O99013 Anemia complicating pregnancy, third trimester: Secondary | ICD-10-CM | POA: Diagnosis present

## 2015-04-04 DIAGNOSIS — O98513 Other viral diseases complicating pregnancy, third trimester: Secondary | ICD-10-CM | POA: Diagnosis present

## 2015-04-04 DIAGNOSIS — O99343 Other mental disorders complicating pregnancy, third trimester: Secondary | ICD-10-CM | POA: Diagnosis present

## 2015-04-04 DIAGNOSIS — O1493 Unspecified pre-eclampsia, third trimester: Secondary | ICD-10-CM | POA: Diagnosis present

## 2015-04-04 DIAGNOSIS — Z79899 Other long term (current) drug therapy: Secondary | ICD-10-CM | POA: Diagnosis not present

## 2015-04-04 DIAGNOSIS — Z6841 Body Mass Index (BMI) 40.0 and over, adult: Secondary | ICD-10-CM | POA: Diagnosis not present

## 2015-04-04 DIAGNOSIS — B009 Herpesviral infection, unspecified: Secondary | ICD-10-CM | POA: Diagnosis present

## 2015-04-04 DIAGNOSIS — O1494 Unspecified pre-eclampsia, complicating childbirth: Secondary | ICD-10-CM | POA: Diagnosis present

## 2015-04-04 DIAGNOSIS — O1403 Mild to moderate pre-eclampsia, third trimester: Secondary | ICD-10-CM | POA: Diagnosis present

## 2015-04-04 DIAGNOSIS — O99213 Obesity complicating pregnancy, third trimester: Secondary | ICD-10-CM | POA: Diagnosis present

## 2015-04-04 DIAGNOSIS — R609 Edema, unspecified: Secondary | ICD-10-CM | POA: Diagnosis present

## 2015-04-04 DIAGNOSIS — D649 Anemia, unspecified: Secondary | ICD-10-CM | POA: Diagnosis present

## 2015-04-04 DIAGNOSIS — Z3A30 30 weeks gestation of pregnancy: Secondary | ICD-10-CM | POA: Diagnosis not present

## 2015-04-04 LAB — COMPREHENSIVE METABOLIC PANEL
ALBUMIN: 2.1 g/dL — AB (ref 3.5–5.0)
ALK PHOS: 143 U/L — AB (ref 38–126)
ALT: 13 U/L — AB (ref 14–54)
AST: 25 U/L (ref 15–41)
Anion gap: 7 (ref 5–15)
BUN: 17 mg/dL (ref 6–20)
CALCIUM: 8.5 mg/dL — AB (ref 8.9–10.3)
CO2: 21 mmol/L — AB (ref 22–32)
CREATININE: 0.75 mg/dL (ref 0.44–1.00)
Chloride: 106 mmol/L (ref 101–111)
GFR calc Af Amer: >60 mL/min (ref >60)
GFR calc non Af Amer: >60 mL/min (ref >60)
GLUCOSE: 214 mg/dL — AB (ref 65–99)
Potassium: 4.3 mmol/L (ref 3.5–5.1)
SODIUM: 134 mmol/L — AB (ref 135–145)
Total Bilirubin: 0.3 mg/dL (ref 0.3–1.2)
Total Protein: 6.7 g/dL (ref 6.5–8.1)

## 2015-04-04 LAB — CBC
HCT: 29.8 % — ABNORMAL LOW (ref 35.0–47.0)
HEMOGLOBIN: 9 g/dL — AB (ref 12.0–16.0)
MCH: 20.8 pg — ABNORMAL LOW (ref 26.0–34.0)
MCHC: 30.2 g/dL — ABNORMAL LOW (ref 32.0–36.0)
MCV: 68.9 fL — ABNORMAL LOW (ref 80.0–100.0)
Platelets: 248 10*3/uL (ref 150–440)
RBC: 4.32 MIL/uL (ref 3.80–5.20)
RDW: 17.7 % — ABNORMAL HIGH (ref 11.5–14.5)
WBC: 11.7 10*3/uL — ABNORMAL HIGH (ref 3.6–11.0)

## 2015-04-04 LAB — PROTEIN / CREATININE RATIO, URINE
CREATININE, URINE: 31 mg/dL
Protein Creatinine Ratio: 5 mg/mg{Cre} — ABNORMAL HIGH (ref 0.00–0.15)
TOTAL PROTEIN, URINE: 155 mg/dL

## 2015-04-04 MED ORDER — ACETAMINOPHEN 325 MG PO TABS
650.0000 mg | ORAL_TABLET | ORAL | Status: DC | PRN
  Administered 2015-04-05: 650 mg via ORAL
  Filled 2015-04-04: qty 2

## 2015-04-04 MED ORDER — BETAMETHASONE SOD PHOS & ACET 6 (3-3) MG/ML IJ SUSP
12.0000 mg | Freq: Once | INTRAMUSCULAR | Status: AC
  Administered 2015-04-05: 12 mg via INTRAMUSCULAR
  Filled 2015-04-04: qty 2

## 2015-04-04 MED ORDER — LABETALOL HCL 200 MG PO TABS
200.0000 mg | ORAL_TABLET | Freq: Two times a day (BID) | ORAL | Status: DC
  Administered 2015-04-04 – 2015-04-07 (×6): 200 mg via ORAL
  Filled 2015-04-04 (×7): qty 1

## 2015-04-04 MED ORDER — FERROUS SULFATE 325 (65 FE) MG PO TABS
325.0000 mg | ORAL_TABLET | Freq: Every day | ORAL | Status: DC
  Administered 2015-04-05 – 2015-04-07 (×3): 325 mg via ORAL
  Filled 2015-04-04 (×3): qty 1

## 2015-04-04 MED ORDER — PRENATAL MULTIVITAMIN CH
1.0000 | ORAL_TABLET | Freq: Every day | ORAL | Status: DC
  Administered 2015-04-05 – 2015-04-08 (×4): 1 via ORAL
  Filled 2015-04-04 (×7): qty 1

## 2015-04-04 MED ORDER — CALCIUM CARBONATE ANTACID 500 MG PO CHEW
2.0000 | CHEWABLE_TABLET | ORAL | Status: DC | PRN
  Filled 2015-04-04: qty 2

## 2015-04-04 MED ORDER — BETAMETHASONE SOD PHOS & ACET 6 (3-3) MG/ML IJ SUSP
12.0000 mg | Freq: Once | INTRAMUSCULAR | Status: AC
  Administered 2015-04-04: 12 mg via INTRAMUSCULAR
  Filled 2015-04-04: qty 2

## 2015-04-04 MED ORDER — LEVOTHYROXINE SODIUM 50 MCG PO TABS
50.0000 ug | ORAL_TABLET | Freq: Every day | ORAL | Status: DC
  Administered 2015-04-05 – 2015-04-08 (×4): 50 ug via ORAL
  Filled 2015-04-04 (×7): qty 1

## 2015-04-04 MED ORDER — FAMOTIDINE 20 MG PO TABS
20.0000 mg | ORAL_TABLET | Freq: Two times a day (BID) | ORAL | Status: DC
Start: 2015-04-04 — End: 2015-04-08
  Administered 2015-04-05 – 2015-04-08 (×8): 20 mg via ORAL
  Filled 2015-04-04 (×8): qty 1

## 2015-04-04 NOTE — Progress Notes (Signed)
L&D Progress Notes  S: Slept a little during the night. No headache, CP, SOB, abdominal pain O:  Patient Vitals for the past 24 hrs:  BP Temp Temp src Pulse Resp Height Weight  04/04/15 0655 (!) 140/95 mmHg - - 96 20 - -  04/04/15 0540 (!) 151/92 mmHg 98.4 F (36.9 C) Oral 91 18 - -  04/04/15 0430 - - - - 16 - -  04/04/15 0337 (!) 111/57 mmHg - - 91 18 - -  04/04/15 0236 129/81 mmHg - - (!) 106 20 - -  04/04/15 0118 (!) 140/95 mmHg 98.1 F (36.7 C) Oral (!) 102 18 - -  04/03/15 2330 (!) 141/88 mmHg - - 97 - - -  04/03/15 2315 (!) 143/90 mmHg - - 92 - - -  04/03/15 2300 (!) 143/82 mmHg - - 83 - - -  04/03/15 2245 (!) 147/87 mmHg - - 81 - - -  04/03/15 2230 (!) 134/104 mmHg - - 85 18 - -  04/03/15 2213 - 98.1 F (36.7 C) Oral - 20 5\' 4"  (1.626 m) 269 lb (122.018 kg)  04/03/15 2136 129/83 mmHg - - 90 - - -  04/03/15 2135 (!) 130/101 mmHg - - 89 - - -  General: in NAD FHR: CAt 1 tracing Toco: no contractions seen  A: Preeclampsia without severe features at 30wk3d  Mild blood pressure elevations P: Discussed benefits of BMZ with patient and she agrees with steroid injections Regular diet this AM. MFM consult this morning.  Farrel ConnersGUTIERREZ, Tinlee Navarrette, CNM

## 2015-04-04 NOTE — Consult Note (Addendum)
Maternal-Fetal Medicine Consultation:  I was asked to see Alexis Weiss for a consultation due to admission for preeclampsia.  Alexis Weiss is a 29 year-old G2 P0010 at 3230 3/7 weeks by LMP (consistent with 7 week scan) who presented to labor and delivery for an evaluation of labor. During her evaluation she was found to have elevated blood pressures and proteinuria consistent with preeclampsia. She had no signs or symptoms of severe features.  Her blood pressures have mostly been in the 140-150/90s.  Highest values 130/101 and 134/104.  She denies headache, blurry vision or abdominal pain.  Alexis Weiss states that her pregnancy has been complicated by hypothyroidism and that she has had to take the "sugar drink" three times.   Per the admission H&P she had a normal first trimester screen and normal maternal serum AFP.  PMH:  Hypothyroidism on supplementation, elevated BMI, history of anxiety and depression but no medications this pregnancy. Three-hour GTT in third trimester 99/168/146/114 PSH: Denies PObH: G2 P0010, history of first trimester SAB in 2015. PGYn: History of HSV. Meds: Synthroid, PNV ALL: NKDA SH: Denies tobacco. Unemployed FH: Denies FH of birth defects, MR or genetic disorders   Exam:  Filed Vitals:   04/04/15 0849 04/04/15 1046  BP: 143/89 148/93  Pulse: 86 95  Temp:    Resp: 20 20   Abd: soft, non-distendended, non-tender during US examination Lungs: Clear to auscultation bilateral Heart:  Regular, no murmur Ext: Bilateral +2-+3 edema to upper shin. No clonu  Labs: Sodium 133, Potassium 4.2, BUN 16, Cr 0.86 (up from 0.5), AST 18, ALT 12, Bili 0.2 Hct 28.3, Plt 238, WBC 13.3 24 hour urine pending Urine P/C 5.0 g  Prenatal labs: Blood type A positive, antibody screen neg rubella immune, varicella immune, Hep B neg, HIV neg, RPR non-reactive, Urine tox neg, Three hour GTT 99/168/146/114, baseline P/C 83 mg, baseline serum creatinine 0.5.  Normal first trimester  screen and normal MS AFP.  US (today): See US report. EFW 2239 g (>95%) with AC measuring 4 weeks ahead, AFI 15.2, BPP 8/8, cephalic NST (today):  130, moderate variability, reactive, no decels  Assessment and Recommendations: 29 year-old G2 P0010 at 30 3/7 weeks admitted with preeclampsia without severe features. She denies neurologic symptoms.  Her serum creatinine is mildly elevated at 0.86, up from 0.5 at baseline.  Fetal status is reassuring and ultrasound demonstrates approprate interval growth.  -Keep in-house for 2-3 days to see how her symptoms and blood pressure stabalize -Daily labs while in-house  -Daily NST -Finish antenatal corticosteroid course -Given that three hour GTT was borderline and with EFW greater than the 95% and AC markedly ahead, recommend to initiate glucose monitoring, especially while receiving steroids.  -Deliver for non-reassuring fetal testing, neurologic symptoms that are not responsive to conservative approach, signs of end-organ dysfuction, or inability to control blood pressure with single agent.   -Can consider starting labetalol (100-200 mg every 12 hours) and increase as needed to a max of 1800-2400 daily.  Treat severe-range pressures within 15-30 minutes using the protocols found in ACOG Committee Opinion 623 (February 2015). -Perform sterile vaginal exam prior to ensure no HSV lesions prior to attempts at vaginal delivery -check urine tox screen -If develops severe-range pressures, then she is not a candidate for outpatient management.   -If remains without severe features and serum creatinine is stable, then can be considered for outpt mgt. If discharged, needs twice-weekly visits with BP measurements, twice-weekly fetal testing, weekly labs and delivery  by 37 weeks unless develops severe features.  Should she develop severe features after 34 weeks proceed with delivery.  Should she develop severe features prior to 34 weeks and have questions on  indications for delivery, please contact the on-call MFM provider at [phone]  Escarlet Saathoff, [place] A, MD

## 2015-04-04 NOTE — Progress Notes (Signed)
Pt up to BR at 0205- void captured for 24h urine collection-approx 75 cc- tea colored in nature. Pt declining toileting presently. States she has drunk approx 4 20 0z tumblers of fluid at home today. While in hospital pt drinking 3 160 cc cups fluid. CNM C Gutierrez aware.

## 2015-04-04 NOTE — Progress Notes (Signed)
Results for orders placed or performed during the hospital encounter of 04/03/15 (from the past 72 hour(s))  Protein / creatinine ratio, urine     Status: Abnormal   Collection Time: 04/03/15 10:43 PM  Result Value Ref Range   Creatinine, Urine 31 mg/dL   Total Protein, Urine 155 mg/dL    Comment: NO NORMAL RANGE ESTABLISHED FOR THIS TEST   Protein Creatinine Ratio 5.00 (H) 0.00 - 0.15 mg/mg[Cre]  Comprehensive metabolic panel     Status: Abnormal   Collection Time: 04/03/15 11:14 PM  Result Value Ref Range   Sodium 133 (L) 135 - 145 mmol/L   Potassium 4.2 3.5 - 5.1 mmol/L   Chloride 102 101 - 111 mmol/L   CO2 24 22 - 32 mmol/L   Glucose, Bld 140 (H) 65 - 99 mg/dL   BUN 16 6 - 20 mg/dL   Creatinine, Ser 0.86 0.44 - 1.00 mg/dL   Calcium 9.0 8.9 - 10.3 mg/dL   Total Protein 6.2 (L) 6.5 - 8.1 g/dL   Albumin 2.0 (L) 3.5 - 5.0 g/dL   AST 18 15 - 41 U/L   ALT 12 (L) 14 - 54 U/L   Alkaline Phosphatase 132 (H) 38 - 126 U/L   Total Bilirubin 0.2 (L) 0.3 - 1.2 mg/dL   GFR calc non Af Amer >60 >60 mL/min   GFR calc Af Amer >60 >60 mL/min    Comment: (NOTE) The eGFR has been calculated using the CKD EPI equation. This calculation has not been validated in all clinical situations. eGFR's persistently <60 mL/min signify possible Chronic Kidney Disease.    Anion gap 7 5 - 15  CBC     Status: Abnormal   Collection Time: 04/03/15 11:14 PM  Result Value Ref Range   WBC 13.3 (H) 3.6 - 11.0 K/uL   RBC 4.19 3.80 - 5.20 MIL/uL   Hemoglobin 8.9 (L) 12.0 - 16.0 g/dL   HCT 28.3 (L) 35.0 - 47.0 %   MCV 67.5 (L) 80.0 - 100.0 fL   MCH 21.4 (L) 26.0 - 34.0 pg   MCHC 31.6 (L) 32.0 - 36.0 g/dL   RDW 17.4 (H) 11.5 - 14.5 %   Platelets 238 150 - 440 K/uL  CBC     Status: Abnormal   Collection Time: 04/04/15  3:15 PM  Result Value Ref Range   WBC 11.7 (H) 3.6 - 11.0 K/uL   RBC 4.32 3.80 - 5.20 MIL/uL   Hemoglobin 9.0 (L) 12.0 - 16.0 g/dL   HCT 29.8 (L) 35.0 - 47.0 %   MCV 68.9 (L) 80.0 - 100.0 fL    MCH 20.8 (L) 26.0 - 34.0 pg   MCHC 30.2 (L) 32.0 - 36.0 g/dL   RDW 17.7 (H) 11.5 - 14.5 %   Platelets 248 150 - 440 K/uL  Comprehensive metabolic panel     Status: Abnormal   Collection Time: 04/04/15  3:15 PM  Result Value Ref Range   Sodium 134 (L) 135 - 145 mmol/L   Potassium 4.3 3.5 - 5.1 mmol/L   Chloride 106 101 - 111 mmol/L   CO2 21 (L) 22 - 32 mmol/L   Glucose, Bld 214 (H) 65 - 99 mg/dL   BUN 17 6 - 20 mg/dL   Creatinine, Ser 0.75 0.44 - 1.00 mg/dL   Calcium 8.5 (L) 8.9 - 10.3 mg/dL   Total Protein 6.7 6.5 - 8.1 g/dL   Albumin 2.1 (L) 3.5 - 5.0 g/dL   AST 25 15 -  41 U/L   ALT 13 (L) 14 - 54 U/L   Alkaline Phosphatase 143 (H) 38 - 126 U/L   Total Bilirubin 0.3 0.3 - 1.2 mg/dL   GFR calc non Af Amer >60 >60 mL/min   GFR calc Af Amer >60 >60 mL/min    Comment: (NOTE) The eGFR has been calculated using the CKD EPI equation. This calculation has not been validated in all clinical situations. eGFR's persistently <60 mL/min signify possible Chronic Kidney Disease.    Anion gap 7 5 - 15   Labs stable, will move to floor as antepartum patient.  Continue 24-hr urine collection.  Daily labs and NST, has not yet received labetalol

## 2015-04-04 NOTE — Progress Notes (Addendum)
Obstetric and Gynecology  Subjective  No headaches, vision changes, RUQ/epigastric pain.  +FM, no LOF, no VB, no ctx  Objective   Filed Vitals:   04/04/15 0849 04/04/15 1046  BP: 143/89 148/93  Pulse: 86 95  Temp:    Resp: 20 20     Intake/Output Summary (Last 24 hours) at 04/04/15 1331 Last data filed at 04/04/15 0932  Gross per 24 hour  Intake    375 ml  Output    550 ml  Net   -175 ml    General: NAD Pulmonary: no increased work of breathing Abdomen: Obese, gravid, non-tender Extremities: +1 BLE pretibial edema  Labs: Results for orders placed or performed during the hospital encounter of 04/03/15 (from the past 24 hour(s))  Protein / creatinine ratio, urine     Status: Abnormal   Collection Time: 04/03/15 10:43 PM  Result Value Ref Range   Creatinine, Urine 31 mg/dL   Total Protein, Urine 155 mg/dL   Protein Creatinine Ratio 5.00 (H) 0.00 - 0.15 mg/mg[Cre]  Comprehensive metabolic panel     Status: Abnormal   Collection Time: 04/03/15 11:14 PM  Result Value Ref Range   Sodium 133 (L) 135 - 145 mmol/L   Potassium 4.2 3.5 - 5.1 mmol/L   Chloride 102 101 - 111 mmol/L   CO2 24 22 - 32 mmol/L   Glucose, Bld 140 (H) 65 - 99 mg/dL   BUN 16 6 - 20 mg/dL   Creatinine, Ser 1.610.86 0.44 - 1.00 mg/dL   Calcium 9.0 8.9 - 09.610.3 mg/dL   Total Protein 6.2 (L) 6.5 - 8.1 g/dL   Albumin 2.0 (L) 3.5 - 5.0 g/dL   AST 18 15 - 41 U/L   ALT 12 (L) 14 - 54 U/L   Alkaline Phosphatase 132 (H) 38 - 126 U/L   Total Bilirubin 0.2 (L) 0.3 - 1.2 mg/dL   GFR calc non Af Amer >60 >60 mL/min   GFR calc Af Amer >60 >60 mL/min   Anion gap 7 5 - 15  CBC     Status: Abnormal   Collection Time: 04/03/15 11:14 PM  Result Value Ref Range   WBC 13.3 (H) 3.6 - 11.0 K/uL   RBC 4.19 3.80 - 5.20 MIL/uL   Hemoglobin 8.9 (L) 12.0 - 16.0 g/dL   HCT 04.528.3 (L) 40.935.0 - 81.147.0 %   MCV 67.5 (L) 80.0 - 100.0 fL   MCH 21.4 (L) 26.0 - 34.0 pg   MCHC 31.6 (L) 32.0 - 36.0 g/dL   RDW 91.417.4 (H) 78.211.5 - 95.614.5 %   Platelets 238 150 - 440 K/uL    Cultures: No results found for this or any previous visit.   Assessment   29 y.o. G2P0010 at 1665w3d admitted for preeclampsia without severe features   Plan   1) Case discussed with [place] Grotegut, MD.  Given early gestational age, degree of proteinuria (normal baseline P/C ratio), and Cr increase from baseline of 0.47 to 0.86 will monitor inpatientor the next few days to verify no worsening of symptoms and to trend labs.  Delivery indications are worsening BP (maxing out on single agent), neurologic symptoms, no non-reassuring fetal surveillance.  If patient remains stable will need twice weekly antepartum testing, with once weekly labs.   - if repeat labs are stable may go to floor as antepartum patient - start labetalol 200mg  po bid  2) Fetus - category I tracing

## 2015-04-04 NOTE — H&P (Signed)
L&D Triage H&P   OB History & Physical   History of Present Illness:  Chief Complaint:  Presented originally with BLAPs after washing her car today. Pains were constant but would intensify periodically. Pain resolved after arrival to hospital. Baby active. Denies headaches, visual changes, RUQ pain. Has had a lot of swelling inlower extremities.  HPI:  Alexis Weiss is a 29 y.o. G68P0010 female with EDC=06/10/2015 at [redacted]w[redacted]d dated by a 7 week ultrasound.  Her pregnancy has been complicated by hypothyroidism, BMI>40 (now 47.7), fasting hyperglycemia, anemia, and circumvallate placenta..  She presented to L&D originally for evaluation of BLAP, but was noted to have elevated blood pressures in the mild range and proteinuria.  She has had problems with edema in her hands and LE since 22 weeks and has been gaining 4-6#/week. At her last visit in office a week ago her BP was 112/78 and there was no proteinuria.    Prenatal care site: Prenatal care at [facility] was begun in the first trimester and has been remarkable for an elevated TSH requiring Synthroid (currently on 50 mcg/day), negative First trimester test and negative MSAFP, and  an elevated early one hr GTT =152 with normal 3hr GTT, then elevated FBS when 3hr GTT was repeated at 28 weeks. P/C ratio at 20 weeks was 215 mgm. 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 History of anxiety and depression (stopped meds with pregnancy). As well as HSV II. Growth scan at 27wk3 days with EFW 1274gms (2#13oz) or 71.3%      Maternal Medical History:   Past Medical History  Diagnosis Date  . Miscarriage   . Hypothyroid     on med- does not rmember name or dosage  . Anemia     with pregnancy- on Iron med  . Morbid obesity with body mass index (BMI) of 45.0 to 49.9 in adult [facility])   . Anxiety   . Depression     Past Surgical History  Procedure Laterality Date  . No past surgeries      No Known Allergies  Prior to Admission  medications   Medication Sig Start Date End Date Taking? Authorizing Provider  ferrous sulfate 325 (65 FE) MG tablet Take 325 mg by mouth daily with breakfast.   Yes Historical Provider, MD  levothyroxine (SYNTHROID, LEVOTHROID) 50 MCG tablet Take 50 mcg by mouth daily before breakfast.   Yes Historical Provider, MD  magic mouthwash w/lidocaine SOLN Take 5 mLs by mouth 4 (four) times daily. 03/03/15  Yes Delorise Royals Cuthriell, PA-C  Prenatal Vit-Fe Fumarate-FA (MULTIVITAMIN-PRENATAL) 27-0.8 MG TABS tablet Take 1 tablet by mouth daily at 12 noon.   Yes Historical Provider, MD  ranitidine (ZANTAC) 150 MG tablet Take 150 mg by mouth 1 day or 1 dose.   Yes Historical Provider, MD  amoxicillin (AMOXIL) 875 MG tablet Take 1 tablet (875 mg total) by mouth 2 (two) times daily. Patient not taking: Reported on 04/03/2015 03/03/15   Delorise Royals Cuthriell, PA-C          Social History: She  reports that she has never smoked. She does not have any smokeless tobacco history on file. She reports that she does not drink alcohol or use illicit drugs. Had a history of using MJ with a positive UDS in July. Repeat UDS in November was negative.  Family History: family history is not on file.   Review of Systems: Negative x 10 systems reviewed except as noted in  the HPI.      Physical Exam:  Vital Signs: Patient Vitals for the past 24 hrs:  BP Temp Temp src Pulse Resp Height Weight  04/03/15 2330 (!) 141/88 mmHg - - 97 - - -  04/03/15 2315 (!) 143/90 mmHg - - 92 - - -  04/03/15 2300 (!) 143/82 mmHg - - 83 - - -  04/03/15 2245 (!) 147/87 mmHg - - 81 - - -  04/03/15 2230 (!) 134/104 mmHg - - 85 18 - -  04/03/15 2213 - 98.1 F (36.7 C) Oral - 20 5\' 4"  (1.626 m) 269 lb (122.018 kg)  04/03/15 2136 129/83 mmHg - - 90 - - -  04/03/15 2135 (!) 130/101 mmHg - - 89 - - -  General: no acute distress.  HEENT: normocephalic, atraumatic Heart: regular rate & rhythm.  No murmurs/ Lungs: clear to auscultation  bilaterally Abdomen: soft, gravid, non-tender;   Pelvic:   External: Normal external female genitalia  Cervix: Dilation: Closed / Effacement (%): Thick / Station: Ballotable   Extremities: non-tender, symmetric, +2 edema bilaterally.  DTRs: +1  Neurologic: Alert & oriented x 3.    Pertinent Results:  Prenatal Labs: Blood type/Rh A positive  Antibody screen negative  Rubella Varicella Immune Immune  RPR Non reactive  HBsAg negative  HIV Non reactive  GC negative  Chlamydia negative  Genetic screening FIRST and MSAFP negative  1 hour GTT 152  3 hour GTT #1 WNL then repeat 99/168/136/114  GBS unknown   Baseline FHR: 140s with accelerations to 160s to 170, moderate variability Toco: no contractions seen Bedside Ultrasound:  Number of Fetus: single  Presentation: cephalic  Placental Location: anterior Results for orders placed or performed during the hospital encounter of 04/03/15 (from the past 24 hour(s))  Protein / creatinine ratio, urine     Status: Abnormal   Collection Time: 04/03/15 10:43 PM  Result Value Ref Range   Creatinine, Urine 31 mg/dL   Total Protein, Urine 155 mg/dL   Protein Creatinine Ratio 5.00 (H) 0.00 - 0.15 mg/mg[Cre]  Comprehensive metabolic panel     Status: Abnormal   Collection Time: 04/03/15 11:14 PM  Result Value Ref Range   Sodium 133 (L) 135 - 145 mmol/L   Potassium 4.2 3.5 - 5.1 mmol/L   Chloride 102 101 - 111 mmol/L   CO2 24 22 - 32 mmol/L   Glucose, Bld 140 (H) 65 - 99 mg/dL   BUN 16 6 - 20 mg/dL   Creatinine, Ser 4.090.86 0.44 - 1.00 mg/dL   Calcium 9.0 8.9 - 81.110.3 mg/dL   Total Protein 6.2 (L) 6.5 - 8.1 g/dL   Albumin 2.0 (L) 3.5 - 5.0 g/dL   AST 18 15 - 41 U/L   ALT 12 (L) 14 - 54 U/L   Alkaline Phosphatase 132 (H) 38 - 126 U/L   Total Bilirubin 0.2 (L) 0.3 - 1.2 mg/dL   GFR calc non Af Amer >60 >60 mL/min   GFR calc Af Amer >60 >60 mL/min   Anion gap 7 5 - 15  CBC     Status: Abnormal   Collection Time: 04/03/15 11:14 PM  Result  Value Ref Range   WBC 13.3 (H) 3.6 - 11.0 K/uL   RBC 4.19 3.80 - 5.20 MIL/uL   Hemoglobin 8.9 (L) 12.0 - 16.0 g/dL   HCT 91.428.3 (L) 78.235.0 - 95.647.0 %   MCV 67.5 (L) 80.0 - 100.0 fL   MCH 21.4 (L) 26.0 - 34.0  pg   MCHC 31.6 (L) 32.0 - 36.0 g/dL   RDW 40.9 (H) 81.1 - 91.4 %   Platelets 238 150 - 440 K/uL   Assessment:  ZAYDAH NAWABI is a 29 y.o. G2P0010 female at [redacted]w[redacted]d with preeclampsia without severe features Round ligament pain  Plan:  1. Admit to Labor & Delivery for observation   2. 24 urine for protein 3. Monitor blood pressures overnight and treat if in the severe range 4. MFM consult in AM. 5. Will discuss giving BMZ to patient in AM 6. Consider transfer to tertiary center since our NICU is filling up. 7. Discussed POM with Dr Arminda Resides, Aroura Vasudevan, CNM  Kyndahl Jablon  04/04/2015 1:33 AM

## 2015-04-05 LAB — CBC
HEMATOCRIT: 26.8 % — AB (ref 35.0–47.0)
HEMOGLOBIN: 8.1 g/dL — AB (ref 12.0–16.0)
MCH: 20.7 pg — AB (ref 26.0–34.0)
MCHC: 30.2 g/dL — AB (ref 32.0–36.0)
MCV: 68.7 fL — ABNORMAL LOW (ref 80.0–100.0)
Platelets: 229 10*3/uL (ref 150–440)
RBC: 3.9 MIL/uL (ref 3.80–5.20)
RDW: 17.8 % — AB (ref 11.5–14.5)
WBC: 12.6 10*3/uL — ABNORMAL HIGH (ref 3.6–11.0)

## 2015-04-05 LAB — COMPREHENSIVE METABOLIC PANEL
ALK PHOS: 115 U/L (ref 38–126)
ALT: 12 U/L — ABNORMAL LOW (ref 14–54)
ANION GAP: 9 (ref 5–15)
AST: 17 U/L (ref 15–41)
Albumin: 1.8 g/dL — ABNORMAL LOW (ref 3.5–5.0)
BILIRUBIN TOTAL: 0.3 mg/dL (ref 0.3–1.2)
BUN: 19 mg/dL (ref 6–20)
CALCIUM: 8.2 mg/dL — AB (ref 8.9–10.3)
CO2: 19 mmol/L — ABNORMAL LOW (ref 22–32)
CREATININE: 0.72 mg/dL (ref 0.44–1.00)
Chloride: 107 mmol/L (ref 101–111)
GLUCOSE: 130 mg/dL — AB (ref 65–99)
POTASSIUM: 4.1 mmol/L (ref 3.5–5.1)
Sodium: 135 mmol/L (ref 135–145)
Total Protein: 5.9 g/dL — ABNORMAL LOW (ref 6.5–8.1)

## 2015-04-05 LAB — PROTEIN, URINE, 24 HOUR
Collection Interval-UPROT: 24 hours
PROTEIN, URINE: 942 mg/dL
Protein, 24H Urine: 15543 mg/d — ABNORMAL HIGH (ref 50–100)
URINE TOTAL VOLUME-UPROT: 1650 mL

## 2015-04-05 LAB — GLUCOSE, CAPILLARY
Glucose-Capillary: 176 mg/dL — ABNORMAL HIGH (ref 65–99)
Glucose-Capillary: 199 mg/dL — ABNORMAL HIGH (ref 65–99)
Glucose-Capillary: 242 mg/dL — ABNORMAL HIGH (ref 65–99)

## 2015-04-05 MED ORDER — INSULIN ASPART 100 UNIT/ML ~~LOC~~ SOLN
0.0000 [IU] | SUBCUTANEOUS | Status: DC
  Administered 2015-04-05: 8 [IU] via SUBCUTANEOUS
  Administered 2015-04-06: 2 [IU] via SUBCUTANEOUS
  Administered 2015-04-06 (×3): 4 [IU] via SUBCUTANEOUS
  Administered 2015-04-06: 8 [IU] via SUBCUTANEOUS
  Administered 2015-04-06: 2 [IU] via SUBCUTANEOUS
  Filled 2015-04-05: qty 8
  Filled 2015-04-05: qty 2
  Filled 2015-04-05: qty 4
  Filled 2015-04-05: qty 2
  Filled 2015-04-05: qty 4
  Filled 2015-04-05: qty 8
  Filled 2015-04-05: qty 4

## 2015-04-05 MED ORDER — VALACYCLOVIR HCL 500 MG PO TABS
500.0000 mg | ORAL_TABLET | Freq: Two times a day (BID) | ORAL | Status: DC
  Administered 2015-04-05 – 2015-04-08 (×7): 500 mg via ORAL
  Filled 2015-04-05 (×10): qty 1

## 2015-04-05 MED ORDER — MAGNESIUM SULFATE 50 % IJ SOLN
5.0000 g | Freq: Once | INTRAMUSCULAR | Status: DC | PRN
  Filled 2015-04-05: qty 10

## 2015-04-05 MED ORDER — PROMETHAZINE HCL 25 MG PO TABS
25.0000 mg | ORAL_TABLET | Freq: Four times a day (QID) | ORAL | Status: DC | PRN
  Administered 2015-04-05: 25 mg via ORAL
  Filled 2015-04-05: qty 1

## 2015-04-05 NOTE — Procedures (Signed)
FETAL SURVEILLANCE TESTING SUMMARY  INDICATIONS: 1) Supervision high risk pregnancy, third trimester 2) preeclampsia, antepartum, third trimester 3) obesity complicating pregnancy, third trimester 4) BMI 45.0-49.9 5) [redacted] weeks gestational age  OBJECTIVE RESULTS: Fetal heart variability: moderate Fetal Heart Rate decelerations: none Fetal Heart Rate accelerations: yes Baseline FHR: 135 beat per minute Fetal Non-stress Test: reactive Uterine contractions: none  Fetal surveillance: reassuring  Thomasene MohairStephen Dinari Stgermaine, MD 04/05/2015 12:00 PM

## 2015-04-05 NOTE — Progress Notes (Addendum)
Patient ID: Alexis AndaCathy M McCuiston, female   DOB: 05/20/1985, 29 y.o.   MRN: [id number] Daily Antepartum Progress Note Brion AlimentCathy M McCuiston  [id number]  HD#2 Subjective:  Overnight Events: None Complaints: None today. Notes +FM, denies ctx/LOF/vaginal bleeding. Denies headaches, visual changes, and RUQ pain She denies: fevers, chills, chest pain, trouble breathing, nausea, vomiting, severe abdominal pain.  She has tolerated: normal diet as tolerated She is ambulating and is voiding.   Objective:  Most recent vitals Temp: 98.4 F (36.9 C)  BP: 126/71 mmHg  Pulse Rate: (!) 107  Resp: 18  SpO2: 98 %   Vitals Range over 24 hours BP  Min: 98/65  Max: 161/92 Temp  Avg: 98.4 F (36.9 C)  Min: 98 F (36.7 C)  Max: 98.9 F (37.2 C) Pulse  Avg: 96.5  Min: 68  Max: 107 SpO2  Avg: 98.5 %  Min: 98 %  Max: 99 %   Physical Exam General: alert, well appearing, and in no distress and oriented to person, place, and time Heart: regular rate and rhythm, no murmurs Lungs: clear to auscultation, no wheezes, rales or rhonchi, symmetric air entry Abdomen: soft, gravid, non-tender, +BS Extremities: no redness or tenderness in the calves or thighs, 1+ edema bilaterally Neuro: DTRs 2+  AM Labs Lab Results  Component Value Date   WBC 12.6* 04/05/2015   HGB 8.1* 04/05/2015   HCT 26.8* 04/05/2015   PLT 229 04/05/2015   NA 135 04/05/2015   K 4.1 04/05/2015   CREATININE 0.72 04/05/2015   BUN 19 04/05/2015   Recent Labs     04/05/15  0402  [id number]  [id number]*   Assessment:  Brion AlimentCathy M McCuiston is a 29 y.o. female HD 2 admitted with preeclampsia without severe features.    Plan:  1) preeclampsia without severe features: getting second dose of bmtz today.  Trend labs daily. Continued labetalol 200mg  po bid. Normotensive since starting.  Will check with [facility] regarding such high urine protein levels.   2) fetal macrosomia:  Check BG values fasting and 2h pp.  Sliding scale insulin, prn. Daily NSTs. 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  7)  Discharge: when clinically stable. Postpartum, if indicated.  ADDENDUM:  [facility] recommended continue to monitor.  If BPs are elevated, [facility] OK to accept in transfer. Will call if necessary. Dr. Leatha GildingLivingston also recommended having [facility] see her again on Thursday.   Conard NovakJackson, Stephen D, MD  04/05/2015 10:03 AM

## 2015-04-06 LAB — CBC
HCT: 25.2 % — ABNORMAL LOW (ref 35.0–47.0)
Hemoglobin: 7.7 g/dL — ABNORMAL LOW (ref 12.0–16.0)
MCH: 20.9 pg — AB (ref 26.0–34.0)
MCHC: 30.5 g/dL — AB (ref 32.0–36.0)
MCV: 68.6 fL — ABNORMAL LOW (ref 80.0–100.0)
Platelets: 227 10*3/uL (ref 150–440)
RBC: 3.67 MIL/uL — ABNORMAL LOW (ref 3.80–5.20)
RDW: 17.7 % — ABNORMAL HIGH (ref 11.5–14.5)
WBC: 14.2 10*3/uL — ABNORMAL HIGH (ref 3.6–11.0)

## 2015-04-06 LAB — COMPREHENSIVE METABOLIC PANEL
ALBUMIN: 1.9 g/dL — AB (ref 3.5–5.0)
ALK PHOS: 108 U/L (ref 38–126)
ALT: 12 U/L — ABNORMAL LOW (ref 14–54)
ANION GAP: 7 (ref 5–15)
AST: 21 U/L (ref 15–41)
BUN: 18 mg/dL (ref 6–20)
CALCIUM: 8.3 mg/dL — AB (ref 8.9–10.3)
CO2: 21 mmol/L — AB (ref 22–32)
Chloride: 109 mmol/L (ref 101–111)
Creatinine, Ser: 0.8 mg/dL (ref 0.44–1.00)
GFR calc Af Amer: >60 mL/min (ref >60)
GFR calc non Af Amer: >60 mL/min (ref >60)
GLUCOSE: 153 mg/dL — AB (ref 65–99)
POTASSIUM: 4.2 mmol/L (ref 3.5–5.1)
SODIUM: 137 mmol/L (ref 135–145)
Total Bilirubin: 0.4 mg/dL (ref 0.3–1.2)
Total Protein: 5.9 g/dL — ABNORMAL LOW (ref 6.5–8.1)

## 2015-04-06 LAB — GLUCOSE, CAPILLARY
GLUCOSE-CAPILLARY: 167 mg/dL — AB (ref 65–99)
GLUCOSE-CAPILLARY: 178 mg/dL — AB (ref 65–99)
GLUCOSE-CAPILLARY: 161 mg/dL — AB (ref 65–99)
Glucose-Capillary: 149 mg/dL — ABNORMAL HIGH (ref 65–99)
Glucose-Capillary: 206 mg/dL — ABNORMAL HIGH (ref 65–99)
Glucose-Capillary: 126 mg/dL — ABNORMAL HIGH (ref 65–99)

## 2015-04-06 NOTE — Care Management Important Message (Signed)
Important Message  Patient Details  Name: Alexis Weiss MRN: [id number] Date of Birth: [birth year]   Medicare Important Message Given:  Yes    Olegario MessierKathy A Annaliesa Blann 04/06/2015, 10:35 AM

## 2015-04-06 NOTE — Progress Notes (Signed)
Upon pt's exam this morning, she verbalized feeling a knot in her left breast. Examined pt and did feel a knot on the left side. Feels like a milk duct gland but will continue to monitor. Ruta HindsKelly Tyrees Chopin, RN 04/06/15 [phone]

## 2015-04-06 NOTE — Progress Notes (Signed)
Patient ID: Alexis AndaCathy M McCuiston, female   DOB: Aug 16, 1985, 29 y.o.   MRN: [id number] Daily Antepartum Progress Note Brion AlimentCathy M McCuiston  [id number]  HD#3 Subjective:  Overnight Events: None Complaints: None today. Notes +FM, denies ctx/LOF/vaginal bleeding. Denies headaches, visual changes, and RUQ pain She denies: fevers, chills, chest pain, trouble breathing, nausea, vomiting, severe abdominal pain.  She has tolerated: normal diet as tolerated.  BS elevated, counseled about foods with sugar in diet. She is ambulating and is voiding.   Objective:  Vitals Range over 24 hours BP  Min: 112/55  Max: 149/89 Temp  Avg: 98.6 F (37 C)  Min: 98.2 F (36.8 C)  Max: 98.9 F (37.2 C) Pulse  Avg: 102.7  Min: 98  Max: 114 SpO2  Avg: 96.7 %  Min: 95 %  Max: 98 %   Physical Exam General: alert, well appearing, and in no distress and oriented to person, place, and time Heart: regular rate and rhythm, no murmurs Lungs: clear to auscultation, no wheezes, rales or rhonchi, symmetric air entry Abdomen: soft, gravid, non-tender, +BS Extremities: no redness or tenderness in the calves or thighs, 1+ edema bilaterally Neuro: DTRs 2+  AM Labs Lab Results  Component Value Date   WBC 14.2* 04/06/2015   HGB 7.7* 04/06/2015   HCT 25.2* 04/06/2015   PLT 227 04/06/2015   NA 137 04/06/2015   K 4.2 04/06/2015   CREATININE 0.80 04/06/2015   BUN 18 04/06/2015   Recent Labs     04/05/15  0402  PROTEIN24HR  [id number]*   Assessment:  Brion AlimentCathy M McCuiston is a 29 y.o. female HD 3 admitted with preeclampsia without severe features.    Plan:  1) preeclampsia without severe features:  Trend labs. Continued labetalol 200mg  po bid. Normotensive since starting, except this morning BP 149/89. Patient to have follow up appt with [facility] tomorrow regarding preeclampsia, 30 weeks, proteinuria.  30 weeks 5 days today.  BMX x2 given.   2) Fetal macrosomia:  Check BG values fasting and before meals, with Sliding scale insulin.  Daily NSTs. 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  7)  Disposition:  If any worsening sx's or findings develop, decisions regarding delivery and location for deliver [facility] stay here.   Letitia LibraHARRIS,Mimi Debellis PAUL, MD  04/06/2015 10:41 AM

## 2015-04-06 NOTE — Progress Notes (Signed)
Clarified blood sugar checks with Dr. Tiburcio PeaHarris on morning rounds. Dr. Tiburcio PeaHarris stated to do a fasting check each morning and then stick with checking at 0800, 1200, 1600 with the sliding scale as coverage; stated not to do the 2 hours postprandial checks. Ruta HindsKelly Tanveer Brammer, RN 04/06/15 [phone]

## 2015-04-07 ENCOUNTER — Inpatient Hospital Stay (HOSPITAL_BASED_OUTPATIENT_CLINIC_OR_DEPARTMENT_OTHER)
Admit: 2015-04-07 | Discharge: 2015-04-07 | Disposition: A | Discharge status: Home / Self Care | Payer: Medicare Other | Attending: Obstetrics & Gynecology | Admitting: Obstetrics & Gynecology

## 2015-04-07 DIAGNOSIS — O1493 Unspecified pre-eclampsia, third trimester: Secondary | ICD-10-CM | POA: Diagnosis not present

## 2015-04-07 LAB — CBC WITH DIFFERENTIAL/PLATELET
Basophils Absolute: 0.1 10*3/uL (ref 0–0.1)
Basophils Relative: 1 %
Eosinophils Absolute: 0 10*3/uL (ref 0–0.7)
Eosinophils Relative: 0 %
HCT: 26 % — ABNORMAL LOW (ref 35.0–47.0)
HEMOGLOBIN: 7.9 g/dL — AB (ref 12.0–16.0)
LYMPHS ABS: 2 10*3/uL (ref 1.0–3.6)
LYMPHS PCT: 16 %
MCH: 20.8 pg — AB (ref 26.0–34.0)
MCHC: 30.4 g/dL — ABNORMAL LOW (ref 32.0–36.0)
MCV: 68.4 fL — AB (ref 80.0–100.0)
Monocytes Absolute: 1 10*3/uL — ABNORMAL HIGH (ref 0.2–0.9)
Monocytes Relative: 8 %
NEUTROS PCT: 75 %
Neutro Abs: 9.7 10*3/uL — ABNORMAL HIGH (ref 1.4–6.5)
Platelets: 222 10*3/uL (ref 150–440)
RBC: 3.8 MIL/uL (ref 3.80–5.20)
RDW: 17.7 % — ABNORMAL HIGH (ref 11.5–14.5)
WBC: 12.7 10*3/uL — AB (ref 3.6–11.0)

## 2015-04-07 LAB — COMPREHENSIVE METABOLIC PANEL
ALT: 12 U/L — AB (ref 14–54)
AST: 18 U/L (ref 15–41)
Albumin: 1.9 g/dL — ABNORMAL LOW (ref 3.5–5.0)
Alkaline Phosphatase: 107 U/L (ref 38–126)
Anion gap: 7 (ref 5–15)
BILIRUBIN TOTAL: 0.3 mg/dL (ref 0.3–1.2)
BUN: 17 mg/dL (ref 6–20)
CHLORIDE: 106 mmol/L (ref 101–111)
CO2: 22 mmol/L (ref 22–32)
CREATININE: 0.68 mg/dL (ref 0.44–1.00)
Calcium: 8.2 mg/dL — ABNORMAL LOW (ref 8.9–10.3)
GFR calc Af Amer: >60 mL/min (ref >60)
Glucose, Bld: 115 mg/dL — ABNORMAL HIGH (ref 65–99)
Potassium: 4.1 mmol/L (ref 3.5–5.1)
Sodium: 135 mmol/L (ref 135–145)
TOTAL PROTEIN: 5.8 g/dL — AB (ref 6.5–8.1)

## 2015-04-07 LAB — GLUCOSE, CAPILLARY
GLUCOSE-CAPILLARY: 136 mg/dL — AB (ref 65–99)
Glucose-Capillary: 119 mg/dL — ABNORMAL HIGH (ref 65–99)
Glucose-Capillary: 119 mg/dL — ABNORMAL HIGH (ref 65–99)

## 2015-04-07 LAB — URIC ACID: Uric Acid, Serum: 8.3 mg/dL — ABNORMAL HIGH (ref 2.3–6.6)

## 2015-04-07 MED ORDER — LABETALOL HCL 200 MG PO TABS
400.0000 mg | ORAL_TABLET | Freq: Two times a day (BID) | ORAL | Status: DC
  Administered 2015-04-07 – 2015-04-08 (×2): 400 mg via ORAL
  Filled 2015-04-07 (×2): qty 2

## 2015-04-07 MED ORDER — FERROUS SULFATE 325 (65 FE) MG PO TABS
325.0000 mg | ORAL_TABLET | Freq: Three times a day (TID) | ORAL | Status: DC
  Administered 2015-04-07 – 2015-04-08 (×3): 325 mg via ORAL
  Filled 2015-04-07 (×3): qty 1

## 2015-04-07 MED ORDER — INSULIN ASPART 100 UNIT/ML ~~LOC~~ SOLN
0.0000 [IU] | Freq: Four times a day (QID) | SUBCUTANEOUS | Status: DC
  Administered 2015-04-07: 2 [IU] via SUBCUTANEOUS
  Filled 2015-04-07: qty 2

## 2015-04-07 NOTE — Progress Notes (Signed)
Patient ID: Alexis Weiss, female   DOB: 07-31-1985, 29 y.o.   MRN: [id number] Daily Antepartum Progress Note Alexis Weiss  [id number]  HD#4 Subjective:  Overnight Events: None Complaints: None today. Notes +FM, denies ctx/LOF/vaginal bleeding. Denies headaches, visual changes, and RUQ pain She denies: fevers, chills, chest pain, trouble breathing, nausea, vomiting, severe abdominal pain.  She has tolerated: normal diet as tolerated.  BS elevated, counseled about foods with sugar in diet. She is ambulating and is voiding.   Objective:  Vitals Range over 24 hours BP  Min: 130/72  Max: 158/89 Temp  Avg: 98.3 F (36.8 C)  Min: 98 F (36.7 C)  Max: 98.8 F (37.1 C) Pulse  Avg: 85.3  Min: 57  Max: 96 SpO2  Avg: 99.3 %  Min: 98 %  Max: 100 %   Physical Exam General: alert, well appearing, and in no distress and oriented to person, place, and time Heart: regular rate and rhythm, no murmurs Lungs: clear to auscultation, no wheezes, rales or rhonchi, symmetric air entry Abdomen: soft, gravid, non-tender, +BS Extremities: no redness or tenderness in the calves or thighs, 1+ edema bilaterally Neuro: DTRs 2+  AM Labs Lab Results  Component Value Date   WBC 12.7* 04/07/2015   HGB 7.9* 04/07/2015   HCT 26.0* 04/07/2015   PLT 222 04/07/2015   NA 135 04/07/2015   K 4.1 04/07/2015   CREATININE 0.68 04/07/2015   BUN 17 04/07/2015   Recent Labs     04/05/15  0402  PROTEIN24HR  [id number]*   Assessment:  Alexis Weiss is a 29 y.o. female HD 4 admitted with preeclampsia without severe features.    Plan:  1) preeclampsia without severe features:  Trend labs. Increase labetalol to 400mg  po bid. Patient seen for follow up appt with [facility]- rec's given, see note.  Once BP under better control to safely treat as outpt, per their rec's, then will d/c with twice weekly f/u, NST biweekly, AFI weekly, growth scan monthly, and labs weekly. Pt is 30 weeks 6 days today.  BMX x2 given.   2)  Fetal macrosomia:  Check BG values fasting and before meals, with Sliding scale insulin. Daily NSTs. 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  7)  Disposition: as above.  Deliver if severe criteria for preeclampsia.   Letitia LibraHARRIS,Remingtyn Depaola PAUL, MD  04/07/2015 3:08 PM

## 2015-04-07 NOTE — Procedures (Signed)
NST:  Baseline  140 mod, + accels no decels  Time >20 mins, occasional contraction  REACTIVE NST Category 1 tracing  Continue expectant management.  ----- Ranae Plumberhelsea Saben Donigan, MD Attending Obstetrician and Gynecologist [facility]

## 2015-04-07 NOTE — Progress Notes (Signed)
[facility] Consultation Follow Up  Chief Complaint: Preeclampsia without severe features  HPI: Alexis Weiss is a 29 y.o. G2P0010 at 8942w6d by LMP c/w 3531w6d US who is seen in clinic for follow up assessment of preeclampsia without severe features.  She is currently an inpatient.  She was admitted to [facility] on Sunday, November 27.  She initially presented with preterm labor symptoms but was noted to have elevated BPs and proteinuria.  Since that time, she has had serial labs, a fetal ultrasound and was seen by Dr. Leone BrandGrotegut on November 28 for recommendations for management of preeclampsia without severe features.  See that consult for details.  Since then she has done well.  She continues to denies signs or symptoms of severe preeclampsia including HA, visual changes, RUQ pain.  She reports normal fetal movement.  She was started on labetalol 200mg  BID which she has tolerated well.  She received a course of betamethasone 11/28 and 11/29 and her BS have been followed - no treatment/insulin has been required.   Past Medical History: Patient  has a past medical history of Miscarriage; Hypothyroid; Anemia; Morbid obesity with body mass index (BMI) of 45.0 to 49.9 in adult [facility](HCC); Anxiety; and Depression. 3hr GTT in the third trimester was 99/168/146/114. Past Surgical History: She  has past surgical history that includes No past surgeries.  Obstetric History:  OB History    Gravida Para Term Preterm AB TAB SAB Ectopic Multiple Living   2    1  1    0      Obstetric Comments   C/o's of plus plus nausea and vomiting, on daily med     Medications: synthroid, Labetalol, PNVs, iron Allergies: Patient has No Known Allergies.  Social History: Patient  reports that she has never smoked. She does not have any smokeless tobacco history on file. She reports that she does not drink alcohol or use illicit drugs. She lives with her partner and her mother, about 10 minutes from the  hospital.  She has access to a care and telephone.  She is not currently employed. Family History: denies FH of birth defects, MR or genetic disorders. Review of Systems A full 12 point review of systems was negative or as noted in the History of Present Illness.  Physical Exam: 146/86 98 temp 90 pulse 20 respirations  1-2+ LE edema CBC    Component Value Date/Time   WBC 12.7* 04/07/2015 0515   RBC 3.80 04/07/2015 0515   HGB 7.9* 04/07/2015 0515   HCT 26.0* 04/07/2015 0515   PLT 222 04/07/2015 0515   MCV 68.4* 04/07/2015 0515   MCH 20.8* 04/07/2015 0515   MCHC 30.4* 04/07/2015 0515   RDW 17.7* 04/07/2015 0515   LYMPHSABS 2.0 04/07/2015 0515   MONOABS 1.0* 04/07/2015 0515   EOSABS 0.0 04/07/2015 0515   BASOSABS 0.1 04/07/2015 0515    Uric acid 8.3 (H) CMP Latest Ref Rng 04/07/2015 04/06/2015 04/05/2015  Glucose 65 - 99 mg/dL 161(W115(H) 960(A153(H) 540(J130(H)  BUN 6 - 20 mg/dL 17 18 19   Creatinine 0.44 - 1.00 mg/dL 8.110.68 9.140.80 7.820.72  Sodium 135 - 145 mmol/L 135 137 135  Potassium 3.5 - 5.1 mmol/L 4.1 4.2 4.1  Chloride 101 - 111 mmol/L 106 109 107  CO2 22 - 32 mmol/L 22 21(L) 19(L)  Calcium 8.9 - 10.3 mg/dL 8.2(L) 8.3(L) 8.2(L)  Total Protein 6.5 - 8.1 g/dL 9.5(A5.8(L) 5.9(L) 5.9(L)  Total Bilirubin 0.3 - 1.2 mg/dL 0.3 0.4 0.3  Alkaline Phos 38 - 126 U/L 107 108 115  AST 15 - 41 U/L ALT 14 - 54 U/L 12(L) 12(L) 12(L)    24hr urine protein 29518 (H)  Last EFW on 11/28 2239 grams (>95th%) with AC measuring 4 weeks ahead, normal AFI, BPP 8/8 NST not yet performed today.  BS this am 119  Asessement: 29yo G2P0010 at [redacted]w[redacted]d with preeclampsia without severe features.  She continues to deny neurological sequelae.  Her serum creatinine improved slightly.  Normal fetal growth and reassuring fetal status.  Plan: It would be reasonable to consider d/c home as long as patient is willing to be seen 2x weekly for BP checks and NSTs with weekly AFI.  She should also be willing to have weekly  labs drawn.  She is aware of the signs and symptoms that would warrant further evaluation and this was explained to her partner and mother as well which include decreased fetal movement, vaginal bleeding or leakage of fluid, headache unresponsive to tylenol, RUQ pain, visual changes, worsening edema or N/V.    She states that she does not work so can stay home and rest.  She has access to a car and lives close to a hospital.  Her mother returns from work in the early afternoon and both she and her husband are home at night.    Expectant management of preeclampsia without severe features is reasonable as an outpatient in compliant patients.  Would deliver, as stated in Dr. Jodie Echevaria consult, for:  1.  non-reassuring fetal testing  2.  neurologic symptoms that are not responsive to conservative approach  3.  signs of end-organ dysfunction (serum transaminases > 2x normal, <100,000 plts/microL, serum creatinine double baseline OR >1.1 mg/dL)  4.  or inability to control blood pressure with single agent.  Per the 2013 ACOG Task Force on Hypertension in Pregnancy, proteinuria and fetal growth restriction are no longer criteria for severe disease and level of proteinuria should not impact decision for delivery in the absence of other severe criteria.  Continue 200 mg labetalol every 12 hours (may increase as needed to a max of 1800-2400 daily. Treat severe-range pressures within 15-30 minutes using the protocols found in ACOG Committee Opinion 623 (February 2015).  Perform sterile vaginal exam prior to delivery to ensure no HSV lesions.  If she develops severe-range pressures, then she is no longer a candidate for outpatient management.   If discharged, needs twice-weekly visits with BP measurements, twice-weekly fetal testing, weekly labs and delivery by 37 weeks unless develops severe features.   Should she develop severe features after 34 weeks proceed with delivery. Should she develop severe  features prior to 34 weeks and have questions on indications for delivery, please contact the on-call MFM provider at [phone].   Total time spent with the patient was 30 minutes with greater than 50% spent in counseling and coordination of care. We appreciate this interesting consult and will be happy to be involved in the ongoing care of Ms. Weiss in anyway her obstetricians desire.  Kirby Funk, MD Maternal-Fetal Medicine [facility]

## 2015-04-07 NOTE — OB Triage Note (Signed)
Pt sent from 347 for daily NST.  NST reactive, pt returned to room by wheelchair.

## 2015-04-07 NOTE — Progress Notes (Signed)
Pt returned to assigned room 347.

## 2015-04-07 NOTE — Progress Notes (Signed)
Pt in wheelchair to [facility] via volunteer.

## 2015-04-07 NOTE — Progress Notes (Signed)
Pt to OBS 3 for daily NST via wheelchair.

## 2015-04-08 LAB — COMPREHENSIVE METABOLIC PANEL
ALBUMIN: 1.8 g/dL — AB (ref 3.5–5.0)
ALK PHOS: 131 U/L — AB (ref 38–126)
ALT: 12 U/L — AB (ref 14–54)
AST: 16 U/L (ref 15–41)
Anion gap: 6 (ref 5–15)
BUN: 15 mg/dL (ref 6–20)
CALCIUM: 8.1 mg/dL — AB (ref 8.9–10.3)
CHLORIDE: 107 mmol/L (ref 101–111)
CO2: 23 mmol/L (ref 22–32)
CREATININE: 0.69 mg/dL (ref 0.44–1.00)
GFR calc non Af Amer: >60 mL/min (ref >60)
GLUCOSE: 94 mg/dL (ref 65–99)
Potassium: 4.1 mmol/L (ref 3.5–5.1)
SODIUM: 136 mmol/L (ref 135–145)
Total Bilirubin: 0.3 mg/dL (ref 0.3–1.2)
Total Protein: 5.7 g/dL — ABNORMAL LOW (ref 6.5–8.1)

## 2015-04-08 LAB — CBC
HCT: 26.7 % — ABNORMAL LOW (ref 35.0–47.0)
HEMOGLOBIN: 8 g/dL — AB (ref 12.0–16.0)
MCH: 20.5 pg — AB (ref 26.0–34.0)
MCHC: 29.8 g/dL — ABNORMAL LOW (ref 32.0–36.0)
MCV: 68.6 fL — ABNORMAL LOW (ref 80.0–100.0)
PLATELETS: 215 10*3/uL (ref 150–440)
RBC: 3.9 MIL/uL (ref 3.80–5.20)
RDW: 17.4 % — ABNORMAL HIGH (ref 11.5–14.5)
WBC: 11.1 10*3/uL — AB (ref 3.6–11.0)

## 2015-04-08 LAB — GLUCOSE, CAPILLARY
Glucose-Capillary: 100 mg/dL — ABNORMAL HIGH (ref 65–99)
Glucose-Capillary: 110 mg/dL — ABNORMAL HIGH (ref 65–99)

## 2015-04-08 MED ORDER — LABETALOL HCL 200 MG PO TABS: 400.0000 mg | ORAL_TABLET | Freq: Two times a day (BID) | ORAL | Status: DC

## 2015-04-08 MED ORDER — VALACYCLOVIR HCL 500 MG PO TABS: 500.0000 mg | ORAL_TABLET | Freq: Two times a day (BID) | ORAL | Status: DC

## 2015-04-08 NOTE — Progress Notes (Signed)
Pt to OBS3 for daily NST vis wheelchair.

## 2015-04-08 NOTE — Care Management Important Message (Signed)
Important Message  Patient Details  Name: Alexis Weiss MRN: [id number] Date of Birth: [birth year]   Medicare Important Message Given:  Yes    Olegario MessierKathy A Felita Bump 04/08/2015, 11:36 AM

## 2015-04-08 NOTE — Discharge Summary (Signed)
Obstetric Discharge Summary Reason for Admission: Preeclampsia without severe features Prenatal Procedures: Daily NST  HEMOGLOBIN  Date Value Ref Range Status  04/08/2015 8.0* 12.0 - 16.0 g/dL Final   HCT  Date Value Ref Range Status  04/08/2015 26.7* 35.0 - 47.0 % Final    Hospital Course: Patient presented with round ligament pain, discomfort of pregnancy, was noted to have elevated blood pressures on presentation.  BP 140-150/80-90, P/C ration 5.00.  24-hr urine protein with 15056m of protein subsequently.  Patient was seen by MFM with recommendation for serial labs and starting antihypertensive agents.  Labs stable, and BP improved on labetalol 4037mpo bid.  Did receive BMZ x 2 this admission.  Patient will be discharge for close outpatient follow up with twice weekly antepartum surveillance, and once weekly labs.  The patient is aware should her condition worsen she may require repeat inpatient admission and is likely to delivery early.  Prior to discharge Dr. WaLeonides Schanzid draw some lupus labs which are pending at this point.  Results for orders placed or performed during the hospital encounter of 04/03/15 (from the past 72 hour(s))  Glucose, capillary     Status: Abnormal   Collection Time: 04/05/15  1:34 PM  Result Value Ref Range   Glucose-Capillary 176 (H) 65 - 99 mg/dL  Glucose, capillary     Status: Abnormal   Collection Time: 04/05/15  4:37 PM  Result Value Ref Range   Glucose-Capillary 199 (H) 65 - 99 mg/dL  Glucose, capillary     Status: Abnormal   Collection Time: 04/05/15  8:16 PM  Result Value Ref Range   Glucose-Capillary 242 (H) 65 - 99 mg/dL  Glucose, capillary     Status: Abnormal   Collection Time: 04/06/15  4:56 AM  Result Value Ref Range   Glucose-Capillary 149 (H) 65 - 99 mg/dL  CBC     Status: Abnormal   Collection Time: 04/06/15  6:31 AM  Result Value Ref Range   WBC 14.2 (H) 3.6 - 11.0 K/uL   RBC 3.67 (L) 3.80 - 5.20 MIL/uL   Hemoglobin 7.7 (L) 12.0 -  16.0 g/dL   HCT 25.2 (L) 35.0 - 47.0 %   MCV 68.6 (L) 80.0 - 100.0 fL   MCH 20.9 (L) 26.0 - 34.0 pg   MCHC 30.5 (L) 32.0 - 36.0 g/dL   RDW 17.7 (H) 11.5 - 14.5 %   Platelets 227 150 - 440 K/uL  Comprehensive metabolic panel     Status: Abnormal   Collection Time: 04/06/15  6:31 AM  Result Value Ref Range   Sodium 137 135 - 145 mmol/L   Potassium 4.2 3.5 - 5.1 mmol/L   Chloride 109 101 - 111 mmol/L   CO2 21 (L) 22 - 32 mmol/L   Glucose, Bld 153 (H) 65 - 99 mg/dL   BUN 18 6 - 20 mg/dL   Creatinine, Ser 0.80 0.44 - 1.00 mg/dL   Calcium 8.3 (L) 8.9 - 10.3 mg/dL   Total Protein 5.9 (L) 6.5 - 8.1 g/dL   Albumin 1.9 (L) 3.5 - 5.0 g/dL   AST 21 15 - 41 U/L   ALT 12 (L) 14 - 54 U/L   Alkaline Phosphatase 108 38 - 126 U/L   Total Bilirubin 0.4 0.3 - 1.2 mg/dL   GFR calc non Af Amer >60 >60 mL/min   GFR calc Af Amer >60 >60 mL/min    Comment: (NOTE) The eGFR has been calculated using the CKD EPI equation.  This calculation has not been validated in all clinical situations. eGFR's persistently <60 mL/min signify possible Chronic Kidney Disease.    Anion gap 7 5 - 15  Glucose, capillary     Status: Abnormal   Collection Time: 04/06/15  8:49 AM  Result Value Ref Range   Glucose-Capillary 206 (H) 65 - 99 mg/dL  Glucose, capillary     Status: Abnormal   Collection Time: 04/06/15 12:49 PM  Result Value Ref Range   Glucose-Capillary 167 (H) 65 - 99 mg/dL   Comment 1 Notify RN   Glucose, capillary     Status: Abnormal   Collection Time: 04/06/15  5:35 PM  Result Value Ref Range   Glucose-Capillary 178 (H) 65 - 99 mg/dL   Comment 1 Notify RN   Glucose, capillary     Status: Abnormal   Collection Time: 04/06/15  7:37 PM  Result Value Ref Range   Glucose-Capillary 161 (H) 65 - 99 mg/dL  Glucose, capillary     Status: Abnormal   Collection Time: 04/06/15 11:24 PM  Result Value Ref Range   Glucose-Capillary 126 (H) 65 - 99 mg/dL  Glucose, capillary     Status: Abnormal   Collection  Time: 04/07/15  4:17 AM  Result Value Ref Range   Glucose-Capillary 119 (H) 65 - 99 mg/dL  CBC with Differential/Platelet     Status: Abnormal   Collection Time: 04/07/15  5:15 AM  Result Value Ref Range   WBC 12.7 (H) 3.6 - 11.0 K/uL   RBC 3.80 3.80 - 5.20 MIL/uL   Hemoglobin 7.9 (L) 12.0 - 16.0 g/dL   HCT 26.0 (L) 35.0 - 47.0 %   MCV 68.4 (L) 80.0 - 100.0 fL   MCH 20.8 (L) 26.0 - 34.0 pg   MCHC 30.4 (L) 32.0 - 36.0 g/dL   RDW 17.7 (H) 11.5 - 14.5 %   Platelets 222 150 - 440 K/uL   Neutrophils Relative % 75 %   Neutro Abs 9.7 (H) 1.4 - 6.5 K/uL   Lymphocytes Relative 16 %   Lymphs Abs 2.0 1.0 - 3.6 K/uL   Monocytes Relative 8 %   Monocytes Absolute 1.0 (H) 0.2 - 0.9 K/uL   Eosinophils Relative 0 %   Eosinophils Absolute 0.0 0 - 0.7 K/uL   Basophils Relative 1 %   Basophils Absolute 0.1 0 - 0.1 K/uL  Uric acid     Status: Abnormal   Collection Time: 04/07/15  5:15 AM  Result Value Ref Range   Uric Acid, Serum 8.3 (H) 2.3 - 6.6 mg/dL  Comprehensive metabolic panel     Status: Abnormal   Collection Time: 04/07/15  5:15 AM  Result Value Ref Range   Sodium 135 135 - 145 mmol/L   Potassium 4.1 3.5 - 5.1 mmol/L   Chloride 106 101 - 111 mmol/L   CO2 22 22 - 32 mmol/L   Glucose, Bld 115 (H) 65 - 99 mg/dL   BUN 17 6 - 20 mg/dL   Creatinine, Ser 0.68 0.44 - 1.00 mg/dL   Calcium 8.2 (L) 8.9 - 10.3 mg/dL   Total Protein 5.8 (L) 6.5 - 8.1 g/dL   Albumin 1.9 (L) 3.5 - 5.0 g/dL   AST 18 15 - 41 U/L   ALT 12 (L) 14 - 54 U/L   Alkaline Phosphatase 107 38 - 126 U/L   Total Bilirubin 0.3 0.3 - 1.2 mg/dL   GFR calc non Af Amer >60 >60 mL/min   GFR calc Af  Amer >60 >60 mL/min    Comment: (NOTE) The eGFR has been calculated using the CKD EPI equation. This calculation has not been validated in all clinical situations. eGFR's persistently <60 mL/min signify possible Chronic Kidney Disease.    Anion gap 7 5 - 15  Glucose, capillary     Status: Abnormal   Collection Time: 04/07/15   8:19 AM  Result Value Ref Range   Glucose-Capillary 119 (H) 65 - 99 mg/dL  Glucose, capillary     Status: Abnormal   Collection Time: 04/07/15  2:07 PM  Result Value Ref Range   Glucose-Capillary 136 (H) 65 - 99 mg/dL   Comment 1 Document in Chart   Glucose, capillary     Status: Abnormal   Collection Time: 04/07/15 11:05 PM  Result Value Ref Range   Glucose-Capillary 100 (H) 65 - 99 mg/dL  CBC     Status: Abnormal   Collection Time: 04/08/15  6:22 AM  Result Value Ref Range   WBC 11.1 (H) 3.6 - 11.0 K/uL   RBC 3.90 3.80 - 5.20 MIL/uL   Hemoglobin 8.0 (L) 12.0 - 16.0 g/dL   HCT 26.7 (L) 35.0 - 47.0 %   MCV 68.6 (L) 80.0 - 100.0 fL   MCH 20.5 (L) 26.0 - 34.0 pg   MCHC 29.8 (L) 32.0 - 36.0 g/dL   RDW 17.4 (H) 11.5 - 14.5 %   Platelets 215 150 - 440 K/uL  Comprehensive metabolic panel     Status: Abnormal   Collection Time: 04/08/15  6:22 AM  Result Value Ref Range   Sodium 136 135 - 145 mmol/L   Potassium 4.1 3.5 - 5.1 mmol/L   Chloride 107 101 - 111 mmol/L   CO2 23 22 - 32 mmol/L   Glucose, Bld 94 65 - 99 mg/dL   BUN 15 6 - 20 mg/dL   Creatinine, Ser 0.69 0.44 - 1.00 mg/dL   Calcium 8.1 (L) 8.9 - 10.3 mg/dL   Total Protein 5.7 (L) 6.5 - 8.1 g/dL   Albumin 1.8 (L) 3.5 - 5.0 g/dL   AST 16 15 - 41 U/L   ALT 12 (L) 14 - 54 U/L   Alkaline Phosphatase 131 (H) 38 - 126 U/L   Total Bilirubin 0.3 0.3 - 1.2 mg/dL   GFR calc non Af Amer >60 >60 mL/min   GFR calc Af Amer >60 >60 mL/min    Comment: (NOTE) The eGFR has been calculated using the CKD EPI equation. This calculation has not been validated in all clinical situations. eGFR's persistently <60 mL/min signify possible Chronic Kidney Disease.    Anion gap 6 5 - 15  Glucose, capillary     Status: Abnormal   Collection Time: 04/08/15 11:00 AM  Result Value Ref Range   Glucose-Capillary 110 (H) 65 - 99 mg/dL   Physical Exam:  General: alert, appears stated age, no distress and morbidly obese Uterine Fundus: soft,  appropriate for gestational age Pulmonary: CTAB DVT Evaluation: No evidence of DVT seen on physical exam.  Discharge Diagnoses: Preelampsia  Discharge Information: Date: 04/08/2015 Activity: modified bedrest, limit sternous activity Diet: regular   Medication List    ASK your doctor about these medications        amoxicillin 875 MG tablet  Commonly known as:  AMOXIL  Take 1 tablet (875 mg total) by mouth 2 (two) times daily.     ferrous sulfate 325 (65 FE) MG tablet  Take 325 mg by mouth daily with breakfast.  levothyroxine 50 MCG tablet  Commonly known as:  SYNTHROID, LEVOTHROID  Take 50 mcg by mouth daily before breakfast.     magic mouthwash w/lidocaine Soln  Take 5 mLs by mouth 4 (four) times daily.     multivitamin-prenatal 27-0.8 MG Tabs tablet  Take 1 tablet by mouth daily at 12 noon.     ranitidine 150 MG tablet  Commonly known as:  ZANTAC  Take 150 mg by mouth 1 day or 1 dose.        Condition: stable Discharge to: home Follow-up Information    Follow up with Ward, Honor Loh, MD On 04/11/2015.   Specialty:  Obstetrics and Gynecology   Why:  @ 2:00 pm for a AFI Ultrasound and 2:40 pm for an Non-stress Test.   Contact information:   [address] [place] [id number] [phone]       Newborn Data: This patient has no babies on file. Home with mother.  Alexis Weiss 04/08/2015, 11:46 AM

## 2015-04-08 NOTE — Discharge Instructions (Signed)

## 2015-04-08 NOTE — Progress Notes (Signed)
Pt returned to assigned room.

## 2015-04-08 NOTE — Progress Notes (Signed)
Pt discharged home.  Discharge instructions, prescriptions and follow up appointment given to and reviewed with pt.  Pt verbalized understanding.  Escorted by auxillary. 

## 2015-04-08 NOTE — Progress Notes (Signed)
L&D Note  29 yo G2 P0010 with EDC=06/10/2015 with preeclampsia without severe features presents at 31 weeks for her daily NST prior to her discharge.  FHR: baseline 140-145 with accelerations to 155-160, moderate variability, no decelerations Toco: some mild irregular contractions that were not felt by patient  A: Reactive NST at 31 weeks  [facility] of [facility] process Preeclampsia precutions reviewed RTN to L&D 12/4 for BP check NST and AFI scheduled for [facility] on 04/11/2015  Farrel ConnersGUTIERREZ, Zakery Normington, CNM

## 2015-04-09 ENCOUNTER — Observation Stay
Admission: EM | Admit: 2015-04-09 | Discharge: 2015-04-09 | Disposition: A | Discharge status: Home / Self Care | Payer: Medicare Other | Attending: Obstetrics & Gynecology | Admitting: Obstetrics & Gynecology

## 2015-04-09 DIAGNOSIS — Z3A31 31 weeks gestation of pregnancy: Secondary | ICD-10-CM | POA: Insufficient documentation

## 2015-04-09 DIAGNOSIS — O1493 Unspecified pre-eclampsia, third trimester: Principal | ICD-10-CM

## 2015-04-09 LAB — CHLAMYDIA/NGC RT PCR (ARMC ONLY)
CHLAMYDIA TR: NOT DETECTED
N GONORRHOEAE: NOT DETECTED

## 2015-04-09 LAB — LUPUS ANTICOAGULANT
DRVVT: 40.2 s (ref 0.0–44.0)
PTT Lupus Anticoagulant: 27.6 s (ref 0.0–40.6)
THROMBIN TIME: 16 s (ref 0.0–20.9)
dPT Confirm Ratio: 1.12 Ratio (ref 0.00–1.40)
dPT: 39.3 s (ref 0.0–55.0)

## 2015-04-09 LAB — SJOGRENS SYNDROME-B EXTRACTABLE NUCLEAR ANTIBODY

## 2015-04-09 LAB — SJOGRENS SYNDROME-A EXTRACTABLE NUCLEAR ANTIBODY

## 2015-04-09 LAB — ANA W/REFLEX IF POSITIVE: ANA: NEGATIVE

## 2015-04-09 LAB — FETAL FIBRONECTIN: Fetal Fibronectin: NEGATIVE

## 2015-04-09 LAB — C4 COMPLEMENT: Complement C4, Body Fluid: 22 mg/dL (ref 14–44)

## 2015-04-09 LAB — C3 COMPLEMENT: C3 COMPLEMENT: 184 mg/dL — AB (ref 82–167)

## 2015-04-09 NOTE — OB Triage Note (Signed)
Ms. Agustina CaroliMcCuiston here with c/o contractions since discharge yesterday. Denies bleeding, LOF. Reports positive fetal movement.

## 2015-04-10 NOTE — Care Management Obs Status (Signed)
MEDICARE OBSERVATION STATUS NOTIFICATION   Patient Details  Name: Alexis Weiss MRN: [id number] Date of Birth: [birth year]   Medicare Observation Status Notification Given:  No (observation less than 24 hours)    Alexis MacadamMichelle Ishan Sanroman, RN 04/10/2015, 9:22 AM

## 2015-04-10 NOTE — Discharge Summary (Signed)
Alexis Weiss is a 29 y.o. female. She is at 3039w2d gestation.  Indication: contractions  S: Resting comfortably. no VB. Active fetal movement. Concerned about persistent contractions, increasing in frequency and intensity.  Was contracting some at discharge from the hospital yesterday.   O:  BP 124/84 mmHg  Pulse 89  Temp(Src) 97.8 F (36.6 C) (Oral)  Resp 20  Ht 5\' 4"  (1.626 m)  Wt 128.368 kg (283 lb)  BMI 48.55 kg/m2  LMP 09/03/2014 Results for orders placed or performed during the hospital encounter of 04/09/15 (from the past 48 hour(s))  Culture, beta strep (group b only)   Collection Time: 04/09/15  2:04 PM  Result Value Ref Range   Specimen Description VAGINAL/RECTAL    Special Requests NONE    Culture NO BETA HEMOLYTIC STREPTOCOCCI ISOLATED    Report Status PENDING   Chlamydia/NGC rt PCR ([facility] only)   Collection Time: 04/09/15  2:04 PM  Result Value Ref Range   Specimen source GC/Chlam ENDOCERVICAL    Chlamydia Tr NOT DETECTED NOT DETECTED   N gonorrhoeae NOT DETECTED NOT DETECTED  Fetal fibronectin   Collection Time: 04/09/15  2:04 PM  Result Value Ref Range   Fetal Fibronectin NEGATIVE NEGATIVE   Appearance, FETFIB CLOUDY (A) CLEAR     Gen: NAD, AAOx3      Abd: FNTTP      Ext: Non-tender, Nonedmeatous    FHT: 145 mod + accels no decels TOCO: q3-627min upon arrival, at time of discharge, they were occasional. SVE: fingertip/long/high no change   A/P:  29yo G2P0010 @ 31.1 with r/o preterm labor, preeclampsia without severe features.   Labor: not present. FFN negative.  S/p BMZ last week.  BP wnl, on Labetalol 400mg  BID  Fetal Wellbeing: Reassuring Cat 1 tracing.  D/c home stable, precautions reviewed, follow-up as scheduled.   Alexis Weiss, Elenora Fenderhelsea C

## 2015-04-11 LAB — CULTURE, BETA STREP (GROUP B ONLY)

## 2015-04-14 ENCOUNTER — Observation Stay
Admission: EM | Admit: 2015-04-14 | Discharge: 2015-04-14 | Payer: Medicare Other | Source: Ambulatory Visit | Attending: Obstetrics & Gynecology | Admitting: Obstetrics & Gynecology

## 2015-04-14 DIAGNOSIS — O98513 Other viral diseases complicating pregnancy, third trimester: Secondary | ICD-10-CM

## 2015-04-14 DIAGNOSIS — O368130 Decreased fetal movements, third trimester, not applicable or unspecified: Principal | ICD-10-CM | POA: Insufficient documentation

## 2015-04-14 DIAGNOSIS — E039 Hypothyroidism, unspecified: Secondary | ICD-10-CM | POA: Insufficient documentation

## 2015-04-14 DIAGNOSIS — O99019 Anemia complicating pregnancy, unspecified trimester: Secondary | ICD-10-CM | POA: Insufficient documentation

## 2015-04-14 DIAGNOSIS — Z6841 Body Mass Index (BMI) 40.0 and over, adult: Secondary | ICD-10-CM | POA: Insufficient documentation

## 2015-04-14 DIAGNOSIS — B009 Herpesviral infection, unspecified: Secondary | ICD-10-CM

## 2015-04-14 DIAGNOSIS — F329 Major depressive disorder, single episode, unspecified: Secondary | ICD-10-CM | POA: Insufficient documentation

## 2015-04-14 DIAGNOSIS — O1213 Gestational proteinuria, third trimester: Secondary | ICD-10-CM

## 2015-04-14 DIAGNOSIS — O368390 Maternal care for abnormalities of the fetal heart rate or rhythm, unspecified trimester, not applicable or unspecified: Secondary | ICD-10-CM

## 2015-04-14 DIAGNOSIS — O3663X0 Maternal care for excessive fetal growth, third trimester, not applicable or unspecified: Secondary | ICD-10-CM

## 2015-04-14 DIAGNOSIS — F1210 Cannabis abuse, uncomplicated: Secondary | ICD-10-CM | POA: Insufficient documentation

## 2015-04-14 DIAGNOSIS — F419 Anxiety disorder, unspecified: Secondary | ICD-10-CM | POA: Diagnosis not present

## 2015-04-14 DIAGNOSIS — Z3A31 31 weeks gestation of pregnancy: Secondary | ICD-10-CM | POA: Insufficient documentation

## 2015-04-14 DIAGNOSIS — R7309 Other abnormal glucose: Secondary | ICD-10-CM | POA: Insufficient documentation

## 2015-04-14 DIAGNOSIS — E790 Hyperuricemia without signs of inflammatory arthritis and tophaceous disease: Secondary | ICD-10-CM

## 2015-04-14 DIAGNOSIS — O99280 Endocrine, nutritional and metabolic diseases complicating pregnancy, unspecified trimester: Secondary | ICD-10-CM

## 2015-04-14 DIAGNOSIS — O1493 Unspecified pre-eclampsia, third trimester: Secondary | ICD-10-CM

## 2015-04-14 DIAGNOSIS — O0993 Supervision of high risk pregnancy, unspecified, third trimester: Secondary | ICD-10-CM

## 2015-04-14 DIAGNOSIS — O368190 Decreased fetal movements, unspecified trimester, not applicable or unspecified: Secondary | ICD-10-CM | POA: Diagnosis present

## 2015-04-14 MED ORDER — ACETAMINOPHEN 325 MG PO TABS: 650.0000 mg | ORAL_TABLET | ORAL | Status: DC | PRN

## 2015-04-14 NOTE — OB Triage Note (Signed)
Pt oriented to OBS rm 3 and triaged for decreased fetal movement in OB office today by Dr. Vergie LivingPickens. Placed on monitors and urine collected. Pt stated , overall swelling has increased in the past week, no c/o HA or upper gastric pain. Pt stated that baby has been active at night but no so much during the day. Will cont to monitor.

## 2015-04-14 NOTE — Procedures (Cosign Needed)
Biophysical profile  +2 fetal movement  +2 flexion/extension +2 AFI >2x2cm pocket +2 Breathing  NST reactive   10/10 BPP   ----- Ranae Plumberhelsea Narely Nobles, MD Attending Obstetrician and Gynecologist [facility]

## 2015-04-14 NOTE — H&P (Signed)
OB History & Physical   History of Present Illness:  Chief Complaint: decreased fetal movement  HPI:  Alexis Weiss is a 29 y.o. 582P0010 female with EDC=06/10/2015 at 31w 6d dated by a 7 week ultrasound.  Her pregnancy has been complicated by hypothyroidism, BMI>40 (now 47.7), fasting hyperglycemia, anemia, circumvallate placenta, and preeclampsia without severe features (abeit with 24hr protein of 15g).  She was kept inpatient during that diagnosis and evaluation and started on Labetalol, now 400mg  BID and maintains mild-range pressures.  She is s/p BMZ on 11/28-29, and did not receive magnesium sulfate for neuroprotection as delivery has not yet been felt to be imminent.  She has been contracting for about a week, with no change to her cervix, and today's NST showed slight but recurrent late variables.   Prenatal care site: [facility] was begun in the first trimester and has been remarkable for an elevated TSH requiring Synthroid (currently on 50 mcg/day), negative First trimester test and negative MSAFP, and  an elevated early one hr GTT =152 with normal 3hr GTT, then elevated FBS when 3hr GTT was repeated at 28 weeks. P/C ratio at 20 weeks was 215 mgm. 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 History of anxiety and depression (stopped meds with pregnancy). As well as HSV II. Suspected macrosomia with Growth scans at 27wk3 days with EFW 1274gms (2#13oz) 71.3%, and at 30.3 weeks was 2239g (>95%) with AC measuring 4 weeks ahead.    She has also been co-managed with [facility] since her admission on 11/28.      Maternal Medical History:   Past Medical History  Diagnosis Date  . Miscarriage   . Hypothyroid     on med- does not rmember name or dosage  . Anemia     with pregnancy- on Iron med  . Morbid obesity with body mass index (BMI) of 45.0 to 49.9 in adult [facility](HCC)   . Anxiety   . Depression     Past Surgical History  Procedure Laterality Date   . No past surgeries      No Known Allergies  Medications:  Tyelnol, PNV, synthroid 50mg , Labetalol 400mg  BID, iron  Social History: She  reports that she has never smoked. She does not have any smokeless tobacco history on file. She reports that she does not drink alcohol or use illicit drugs. Had a history of using MJ with a positive UDS in July. Repeat UDS in November was negative.  Family History: family history is not on file.  No FH of birth defects, MR or genetic disorders  Review of Systems: Negative x 12 systems reviewed except as noted in the HPI.      Physical Exam:  Vital Signs: Patient Vitals for the past 24 hrs:  BP Temp Temp src Pulse SpO2 Height Weight  04/14/15 1251 (!) 146/92 mmHg - - 86 97 % - -  04/14/15 1219 (!) 146/90 mmHg - - 81 - - -  04/14/15 1149 (!) 145/91 mmHg - - 86 - - -  04/14/15 1119 136/89 mmHg - - 84 - - -  04/14/15 1049 (!) 142/92 mmHg - - 85 - - -  04/14/15 1044 - - - - - 5\' 4"  (1.626 m) 129.729 kg (286 lb)  04/14/15 1039 (!) 151/93 mmHg 97.9 F (36.6 C) Oral 90 - - -   General: no acute distress.  HEENT: normocephalic, atraumatic Heart: regular rate & rhythm.  No murmurs/  Lungs: clear to auscultation bilaterally Abdomen: soft, gravid, non-tender;   Pelvic:Cervix: deferred Extremities: non-tender, symmetric, +4 edema bilaterally.  DTRs: +1  Neurologic: Alert & oriented x 3.    Pertinent Results:  Prenatal Labs: Blood type/Rh A positive  Antibody screen negative  Rubella Varicella Immune Immune  RPR Non reactive  HBsAg negative  HIV Non reactive  GC negative  Chlamydia negative  Genetic screening FIRST and MSAFP negative  1 hour GTT 152  3 hour GTT #1 WNL then repeat 99/168/136/114  GBS Negative on 04/11/15  24hr urine on 11/29 15543    Baseline FHR: 135 moderate variablity, +accels + recurrent lates Toco: q6-37min contractions Bedside Ultrasound:  Number of Fetus: single  Presentation: cephalic  Placental Location:  anterior             AFI: 14cm             BPP: 8/8             Interim growth not performed due to fetal ultrasound 1 week ago.  EFW at that time was 2239g  Assessment:  Alexis Weiss is a 29 y.o. G2P0010 female at [redacted]w[redacted]d with preeclampsia without severe features and late variables.  Plan:  1. Admitted to Labor & Delivery for observation   2. Spoke to Dr. Leatha Gilding from [facility] who recommends transfer to [facility]. 3. Continue labetalol  BID 4. Pathophysiology of late decelerations explained in detail to patient and husband, and reason for transfer of care - they are seemingly on board.   ----- Ranae Plumber, MD Attending Obstetrician and Gynecologist [facility]

## 2015-09-13 ENCOUNTER — Ambulatory Visit (INDEPENDENT_AMBULATORY_CARE_PROVIDER_SITE_OTHER): Payer: Medicare Other | Admitting: Family Medicine

## 2015-09-13 ENCOUNTER — Encounter: Payer: Self-pay | Admitting: Family Medicine

## 2015-09-13 VITALS — BP 123/63 | HR 77 | Temp 99.3°F | Resp 16 | Ht 64.0 in | Wt 248.8 lb

## 2015-09-13 DIAGNOSIS — E034 Atrophy of thyroid (acquired): Secondary | ICD-10-CM

## 2015-09-13 DIAGNOSIS — E038 Other specified hypothyroidism: Secondary | ICD-10-CM | POA: Diagnosis not present

## 2015-09-13 DIAGNOSIS — G44229 Chronic tension-type headache, not intractable: Secondary | ICD-10-CM | POA: Diagnosis not present

## 2015-09-13 DIAGNOSIS — E785 Hyperlipidemia, unspecified: Secondary | ICD-10-CM

## 2015-09-13 DIAGNOSIS — O99019 Anemia complicating pregnancy, unspecified trimester: Secondary | ICD-10-CM | POA: Diagnosis not present

## 2015-09-13 DIAGNOSIS — M545 Low back pain, unspecified: Secondary | ICD-10-CM | POA: Insufficient documentation

## 2015-09-13 DIAGNOSIS — B009 Herpesviral infection, unspecified: Secondary | ICD-10-CM | POA: Diagnosis not present

## 2015-09-13 DIAGNOSIS — Z7189 Other specified counseling: Secondary | ICD-10-CM | POA: Diagnosis not present

## 2015-09-13 DIAGNOSIS — Z7689 Persons encountering health services in other specified circumstances: Secondary | ICD-10-CM

## 2015-09-13 DIAGNOSIS — Z3042 Encounter for surveillance of injectable contraceptive: Secondary | ICD-10-CM

## 2015-09-13 MED ORDER — METAXALONE 800 MG PO TABS: 800.0000 mg | ORAL_TABLET | Freq: Three times a day (TID) | ORAL | Status: DC

## 2015-09-13 MED ORDER — MEDROXYPROGESTERONE ACETATE 150 MG/ML IM SUSP: 150.0000 mg | INTRAMUSCULAR | Status: DC

## 2015-09-13 MED ORDER — NAPROXEN 500 MG PO TABS: 500.0000 mg | ORAL_TABLET | Freq: Two times a day (BID) | ORAL | Status: DC

## 2015-09-13 NOTE — Assessment & Plan Note (Signed)
Check TSH to determine if pt still need thyroid replacement in the post-partum period.

## 2015-09-13 NOTE — Assessment & Plan Note (Signed)
Trial of naproxen for headaches. Pt instructed to keep HA diary. Will f/u in 4 weeks.
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
## 2015-09-13 NOTE — Patient Instructions (Signed)
We will check some labwork today.   Back pain: Take naproxen twice daily for a few weeks to see if that helps your pain. I also recommend physical therapy to help with your pain.   We will get XR's of your back today.

## 2015-09-13 NOTE — Assessment & Plan Note (Signed)
Recheck CBC to determine if post-partum anemia resolved.

## 2015-09-13 NOTE — Assessment & Plan Note (Signed)
Pt concerned about whether rash is HSV- will check titers. No need for supressive therapy now that she is not pregnant. Will continue valcylcovir for breakouts.

## 2015-09-13 NOTE — Assessment & Plan Note (Signed)
Next shot due May 19- Jun 3
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
## 2015-09-16 DIAGNOSIS — F329 Major depressive disorder, single episode, unspecified: Secondary | ICD-10-CM | POA: Insufficient documentation

## 2015-09-16 DIAGNOSIS — F32A Depression, unspecified: Secondary | ICD-10-CM | POA: Insufficient documentation

## 2015-10-24 ENCOUNTER — Ambulatory Visit: Payer: Medicare Other | Admitting: Family Medicine

## 2016-01-31 DIAGNOSIS — F419 Anxiety disorder, unspecified: Secondary | ICD-10-CM | POA: Insufficient documentation

## 2017-03-24 ENCOUNTER — Emergency Department
Admission: EM | Admit: 2017-03-24 | Discharge: 2017-03-24 | Disposition: A | Discharge status: Home / Self Care | Payer: Medicare Other | Attending: Emergency Medicine | Admitting: Emergency Medicine

## 2017-03-24 ENCOUNTER — Emergency Department: Payer: Medicare Other

## 2017-03-24 ENCOUNTER — Encounter: Payer: Self-pay | Admitting: Emergency Medicine

## 2017-03-24 DIAGNOSIS — E039 Hypothyroidism, unspecified: Secondary | ICD-10-CM | POA: Insufficient documentation

## 2017-03-24 DIAGNOSIS — Z791 Long term (current) use of non-steroidal anti-inflammatories (NSAID): Secondary | ICD-10-CM | POA: Insufficient documentation

## 2017-03-24 DIAGNOSIS — Z79899 Other long term (current) drug therapy: Secondary | ICD-10-CM | POA: Insufficient documentation

## 2017-03-24 DIAGNOSIS — K8050 Calculus of bile duct without cholangitis or cholecystitis without obstruction: Secondary | ICD-10-CM | POA: Diagnosis not present

## 2017-03-24 DIAGNOSIS — R1010 Upper abdominal pain, unspecified: Secondary | ICD-10-CM | POA: Insufficient documentation

## 2017-03-24 LAB — COMPREHENSIVE METABOLIC PANEL
ALBUMIN: 4.3 g/dL (ref 3.5–5.0)
ALK PHOS: 95 U/L (ref 38–126)
ALT: 24 U/L (ref 14–54)
ANION GAP: 9 (ref 5–15)
AST: 32 U/L (ref 15–41)
BILIRUBIN TOTAL: 0.6 mg/dL (ref 0.3–1.2)
BUN: 9 mg/dL (ref 6–20)
CALCIUM: 9.7 mg/dL (ref 8.9–10.3)
CO2: 22 mmol/L (ref 22–32)
CREATININE: 0.67 mg/dL (ref 0.44–1.00)
Chloride: 106 mmol/L (ref 101–111)
GFR calc non Af Amer: >60 mL/min (ref >60)
GLUCOSE: 131 mg/dL — AB (ref 65–99)
Potassium: 3.5 mmol/L (ref 3.5–5.1)
SODIUM: 137 mmol/L (ref 135–145)
TOTAL PROTEIN: 8.2 g/dL — AB (ref 6.5–8.1)

## 2017-03-24 LAB — CBC
HEMATOCRIT: 40.5 % (ref 35.0–47.0)
HEMOGLOBIN: 13.5 g/dL (ref 12.0–16.0)
MCH: 26.9 pg (ref 26.0–34.0)
MCHC: 33.5 g/dL (ref 32.0–36.0)
MCV: 80.2 fL (ref 80.0–100.0)
Platelets: 215 10*3/uL (ref 150–440)
RBC: 5.04 MIL/uL (ref 3.80–5.20)
RDW: 13.7 % (ref 11.5–14.5)
WBC: 9.1 10*3/uL (ref 3.6–11.0)

## 2017-03-24 LAB — URINALYSIS, COMPLETE (UACMP) WITH MICROSCOPIC
BACTERIA UA: NONE SEEN
BILIRUBIN URINE: NEGATIVE
GLUCOSE, UA: NEGATIVE mg/dL
HGB URINE DIPSTICK: NEGATIVE
Ketones, ur: NEGATIVE mg/dL
LEUKOCYTES UA: NEGATIVE
NITRITE: NEGATIVE
Protein, ur: NEGATIVE mg/dL
SPECIFIC GRAVITY, URINE: 1.021 (ref 1.005–1.030)
pH: 5 (ref 5.0–8.0)

## 2017-03-24 LAB — POCT PREGNANCY, URINE: Preg Test, Ur: NEGATIVE

## 2017-03-24 LAB — LIPASE, BLOOD: Lipase: 25 U/L (ref 11–51)

## 2017-03-24 MED ORDER — TRAMADOL HCL 50 MG PO TABS: 50.0000 mg | ORAL_TABLET | Freq: Four times a day (QID) | ORAL | 0 refills | Status: DC | PRN

## 2017-03-24 NOTE — ED Provider Notes (Signed)
[facility] Emergency Department Provider Note   ____________________________________________    I have reviewed the triage vital signs and the nursing notes.   HISTORY  Chief Complaint Abdominal Pain     HPI Alexis Weiss is a 31 y.o. female who presents with complaints of abdominal pain.  Patient reports she was sitting on toilet when she developed right upper quadrant pain abruptly which she described as sharp and stabbing and severe.  She broke out in a "cold sweat".  Pain did not relieved with lying down.  She has not taken any medication.  Symptoms started 1 hour ago.  She has never had this before.  She has had C-section but no other abdominal surgeries.  8 macaroni and cheese   Past Medical History:  Diagnosis Date  . Anemia    with pregnancy- on Iron med  . Anxiety   . Depression   . Hypothyroid    on med- does not rmember name or dosage  . Miscarriage   . Morbid obesity with body mass index (BMI) of 45.0 to 49.9 in adult [facility](HCC)     Patient Active Problem List   Diagnosis Date Noted  . Depo-Provera contraceptive status 09/13/2015  . Tension headache, chronic 09/13/2015  . Right-sided low back pain without sciatica 09/13/2015  . Abnormal glucose level 04/14/2015  . Anemia affecting pregnancy 04/14/2015  . Cannabis abuse 04/14/2015  . Hypertension in pregnancy, preeclampsia, delivered 04/04/2015  . Morbid obesity (HCC) 04/03/2015  . Hypothyroidism 04/03/2015  . 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 Breast pain 09/09/2013    Past Surgical History:  Procedure Laterality Date  . CESAREAN SECTION    . NO PAST SURGERIES      Prior to Admission medications   Medication Sig Start Date End Date Taking? Authorizing Provider  levothyroxine (SYNTHROID, LEVOTHROID) 50 MCG tablet Take 50 mcg by mouth daily before breakfast.    [provider]  medroxyPROGESTERone (DEPO-PROVERA) 150 MG/ML injection Inject 1 mL  (150 mg total) into the muscle every 3 (three) months. 09/13/15   Krebs, Laurel DimmerAmy Lauren, NP  metaxalone (SKELAXIN) 800 MG tablet Take 1 tablet (800 mg total) by mouth 3 (three) times daily. 09/13/15   Loura PardonKrebs, Amy Lauren, NP  naproxen (NAPROSYN) 500 MG tablet Take 1 tablet (500 mg total) by mouth 2 (two) times daily with a meal. 09/13/15   Krebs, Laurel DimmerAmy Lauren, NP  ranitidine (ZANTAC) 150 MG tablet Take 150 mg by mouth 1 day or 1 dose.    [provider]  traMADol (ULTRAM) 50 MG tablet Take 1 tablet (50 mg total) every 6 (six) hours as needed by mouth. 03/24/17 03/24/18  Jene EveryKinner, Jimmy Stipes, MD  valACYclovir (VALTREX) 500 MG tablet Take 1 tablet (500 mg total) by mouth 2 (two) times daily. 04/08/15   Vena AustriaStaebler, Andreas, MD     Allergies Patient has no known allergies.  Family History  Problem Relation Age of Onset  . COPD Mother   . Asthma Mother   . Hypertension Father     Social History Social History   Tobacco Use  . Smoking status: Never Smoker  . Smokeless tobacco: Never Used  Substance Use Topics  . Alcohol use: No  . Drug use: No    Review of Systems  Constitutional: No fever/chills Eyes: No visual changes.  ENT: No sore throat. Cardiovascular: Denies chest pain. Respiratory: Denies shortness of breath. Gastrointestinal: As above, no vomiting Genitourinary: Negative for dysuria. Musculoskeletal: Negative for  back pain. Skin: Negative for rash. Neurological: Negative for headaches   ____________________________________________   PHYSICAL EXAM:  VITAL SIGNS: ED Triage Vitals [03/24/17 1504]  Enc Vitals Group     BP 114/83     Pulse Rate 89     Resp 16     Temp 98.2 F (36.8 C)     Temp Source Oral     SpO2 100 %     Weight 100.7 kg (222 lb)     Height 1.626 m (5\' 4" )     Head Circumference      Peak Flow      Pain Score 7     Pain Loc      Pain Edu?      Excl. in GC?     Constitutional: Alert and oriented. No acute distress. Pleasant and interactive Eyes:  Conjunctivae are normal.   Nose: No congestion/rhinnorhea. Mouth/Throat: Mucous membranes are moist.   Cardiovascular: Normal rate, regular rhythm. Peri [place].  Good peripheral circulation. Respiratory: Normal respiratory effort.  No retractions. Gastrointestinal: Mild right upper quadrant tenderness to palpation no distention.  No CVA tenderness. Genitourinary: deferred Musculoskeletal:   Warm and well perfused Neurologic:  Normal speech and language. No gross focal neurologic deficits are appreciated.  Skin:  Skin is warm, dry and intact. No rash noted. Psychiatric: Mood and affect are normal. Speech and behavior are normal.  ____________________________________________   LABS (all labs ordered are listed, but only abnormal results are displayed)  Labs Reviewed  COMPREHENSIVE METABOLIC PANEL - Abnormal; Notable for the following components:      Result Value   Glucose, Bld 131 (*)    Total Protein 8.2 (*)    All other components within normal limits  URINALYSIS, COMPLETE (UACMP) WITH MICROSCOPIC - Abnormal; Notable for the following components:   Color, Urine YELLOW (*)    APPearance CLOUDY (*)    Squamous Epithelial / LPF 6-30 (*)    All other components within normal limits  LIPASE, BLOOD  CBC  POC URINE PREG, ED  POCT PREGNANCY, URINE   ____________________________________________  EKG  None ____________________________________________  RADIOLOGY  Ultrasound right upper quadrant demonstrates cholelithiasis ____________________________________________   PROCEDURES  Procedure(s) performed: No  Procedures   Critical Care performed: No ____________________________________________   INITIAL IMPRESSION / ASSESSMENT AND PLAN / ED COURSE  Pertinent labs & imaging results that were available during my care of the patient were reviewed by me and considered in my medical decision making (see chart for details).  Patient well-appearing in no acute distress, mild  tenderness to palpation of upper quadrant.  Lab work is reassuring, pregnancy negative.  We will ultrasound the right upper quadrant to evaluate gallbladder.  Ultrasound shows cholelithiasis, pain has resolved without intervention.  Suspect biliary colic, counseled regarding diet and outpatient follow-up with surgery, return precautions discussed    ____________________________________________   FINAL CLINICAL IMPRESSION(S) / ED DIAGNOSES  Final diagnoses:  Upper abdominal pain  Biliary colic        Note:  This document was prepared using Dragon voice recognition software and may include unintentional dictation errors.    Jene EveryKinner, Deanglo Hissong, MD 03/24/17 1739

## 2017-03-24 NOTE — ED Triage Notes (Signed)
Pt in via POV with complaints of sudden onset RUQ abdominal pain prior to arrival w/ some N/V, denies any diarrhea.  Vitals WDL, NAD noted at this time.

## 2017-04-08 ENCOUNTER — Ambulatory Visit (INDEPENDENT_AMBULATORY_CARE_PROVIDER_SITE_OTHER): Payer: Medicare Other | Admitting: Surgery

## 2017-04-08 ENCOUNTER — Encounter: Payer: Self-pay | Admitting: Surgery

## 2017-04-08 VITALS — BP 127/87 | HR 91 | Temp 98.3°F | Ht 64.0 in | Wt 222.0 lb

## 2017-04-08 DIAGNOSIS — K8020 Calculus of gallbladder without cholecystitis without obstruction: Secondary | ICD-10-CM

## 2017-04-08 DIAGNOSIS — K219 Gastro-esophageal reflux disease without esophagitis: Secondary | ICD-10-CM

## 2017-04-08 MED ORDER — OMEPRAZOLE 40 MG PO CPDR: 40.0000 mg | DELAYED_RELEASE_CAPSULE | Freq: Every day | ORAL | 2 refills | Status: DC

## 2017-04-08 NOTE — Progress Notes (Signed)
04/08/2017  Reason for Visit:  Cholelithiasis  History of Present Illness: Alexis Weiss is a 31 y.o. female referred by the ED Dr. Corky Downs for evaluation of cholelithiasis.  Patient presented to the ED on 11/18 with right upper quadrant abdominal pain after eating mac and cheese.  She reports that after eating, she had to go have a bowel movement and then had sweats and pain which did not improve on its own.  She also had an episode of non-bloody emesis prior to going to the ED. In the ED, her pain resolved and ultrasound showed cholelithiasis and her labs were overall normal.    Since her visit, she mentions she continues to have nausea but no other episodes of severe pain.  When asking more details, her nausea has been ongoing for about a year, and she had another episode of pain about a year ago which resolved on its own and was less severe.  She also feels that acid is coming up when she gets nausea.  Her nausea is not related to her pain or po intake.  She was taking Zantac during her pregnancy but stopped after her c-section.  She has not had any episode of emesis since her ED visit.  When she wakes up in the morning, she does feel a metallic acid taste in her mouth, but she is able to lie flat to sleep. Denies any fevers, chills, chest pain, shortness of breath, constipation, blood in the stool.  Her stool is soft / loose.  Past Medical History: Past Medical History:  Diagnosis Date  . Anemia    with pregnancy- on Iron med  . Anxiety   . Depression   . Hypothyroid    on med- does not rmember name or dosage  . Miscarriage   . Morbid obesity with body mass index (BMI) of 45.0 to 49.9 in adult [facility])      Past Surgical History: Past Surgical History:  Procedure Laterality Date  . CESAREAN SECTION      Home Medications: Prior to Admission medications   Medication --Levothyroxine Sig Start Date End Date Taking? Authorizing Provider    Allergies: No Known Allergies  Social  History:  reports that  has never smoked. she has never used smokeless tobacco. She reports that she does not drink alcohol or use drugs.   Family History: Family History  Problem Relation Age of Onset  . COPD Mother   . Asthma Mother   . Hypertension Father     Review of Systems: Review of Systems  Constitutional: Negative for chills and fever.  HENT: Negative for hearing loss.   Eyes: Negative for blurred vision.  Respiratory: Negative for shortness of breath.   Cardiovascular: Negative for chest pain.  Gastrointestinal: Positive for abdominal pain, nausea and vomiting. Negative for blood in stool and constipation.  Genitourinary: Negative for dysuria.  Musculoskeletal: Negative for myalgias.  Skin: Negative for rash.  Neurological: Negative for dizziness.  Psychiatric/Behavioral: Negative for depression.  All other systems reviewed and are negative.   Physical Exam BP 127/87   Pulse 91   Temp 98.3 F (36.8 C) (Oral)   Ht _0  (1.626 m)   Wt 100.7 kg (222 lb)   BMI 38.11 kg/m  CONSTITUTIONAL: No acute distress HEENT:  Normocephalic, atraumatic, extraocular motion intact. NECK: Trachea is midline, and there is no jugular venous distension.  RESPIRATORY:  Lungs are clear, and breath sounds are equal bilaterally. Normal respiratory effort without pathologic use of accessory muscles.  CARDIOVASCULAR: Heart is regular without murmurs, gallops, or rubs. GI: The abdomen is soft, obese, non-distended, with mild discomfort to palpation over the right upper quadrant, epigastric, and lower abdomen.  Negative Murphy's sign and no peritonitis. There were no palpable masses.  MUSCULOSKELETAL:  Normal muscle strength and tone in all four extremities.  No peripheral edema or cyanosis. SKIN: Skin turgor is normal. There are no pathologic skin lesions.  NEUROLOGIC:  Motor and sensation is grossly normal.  Cranial nerves are grossly intact. PSYCH:  Alert and oriented to person, place and  time. Affect is normal.  Laboratory Analysis: In the ED 11/18:  LFTs within normal.  Na 137, K 3.5, l 105, CO2 22, BUN 9, Cr 0.67, Gluc 131.  WBC 9.1  Imaging: U/S 11/18: IMPRESSION: 1. Cholelithiasis without evidence of acute cholecystitis. 2. Probable fatty infiltration of the liver. 3. No acute findings.  Assessment and Plan: This is a 31 y.o. female who presents with cholelithiasis.  I have independently viewed the patient's imaging study and reviewed her laboratory studies.  Overall her labs were normal in the ED and her ultrasound showed a contracted gallbladder with possible cholelithiasis.  No evidence for cholecystitis.  --Discussed with the patient that currently her symptoms may be multifactorial.  Her nausea may be more related to acid reflux given the sensation of acid refluxing and metallic taste in her mouth in the morning.  She has not had any episodes of pain since her visit to the ED.  Unclear if her other symptoms of food running through her may be related to IBS or something similar.  Overall, gallbladder seems to be a lesser contributor. --Discussed with the patient that first we need to tease out her symptoms better and will start her with a prescription for Omeprazole 40 mg once daily which she can also get over the counter.  Also recommended a low fat diet and will give her information for this. --Will see her back in a month to re-evaluate her symptoms.  She may need a GI referral vs discuss her gallbladder further.  Return precautions / instructions have been given. --Patient understands this plan and all of her questions have been answered.   Melvyn Neth, [place]

## 2017-04-08 NOTE — Patient Instructions (Signed)
We will see you back in 4 weeks to make sure that you're okay.   Low-Fat Diet for Pancreatitis or Gallbladder Conditions A low-fat diet can be helpful if you have pancreatitis or a gallbladder condition. With these conditions, your pancreas and gallbladder have trouble digesting fats. A healthy eating plan with less fat will help rest your pancreas and gallbladder and reduce your symptoms. What do I need to know about this diet?  Eat a low-fat diet. ? Reduce your fat intake to less than 20-30% of your total daily calories. This is less than 50-60 g of fat per day. ? Remember that you need some fat in your diet. Ask your dietician what your daily goal should be. ? Choose nonfat and low-fat healthy foods. Look for the words "nonfat," "low fat," or "fat free." ? As a guide, look on the label and choose foods with less than 3 g of fat per serving. Eat only one serving.  Avoid alcohol.  Do not smoke. If you need help quitting, talk with your health care provider.  Eat small frequent meals instead of three large heavy meals. What foods can I eat? Grains Include healthy grains and starches such as potatoes, wheat bread, fiber-rich cereal, and brown rice. Choose whole grain options whenever possible. In adults, whole grains should account for 45-65% of your daily calories. Fruits and Vegetables Eat plenty of fruits and vegetables. Fresh fruits and vegetables add fiber to your diet. Meats and Other Protein Sources Eat lean meat such as chicken and pork. Trim any fat off of meat before cooking it. Eggs, fish, and beans are other sources of protein. In adults, these foods should account for 10-35% of your daily calories. Dairy Choose low-fat milk and dairy options. Dairy includes fat and protein, as well as calcium. Fats and Oils Limit high-fat foods such as fried foods, sweets, baked goods, sugary drinks. Other Creamy sauces and condiments, such as mayonnaise, can add extra fat. Think about  whether or not you need to use them, or use smaller amounts or low fat options. What foods are not recommended?  High fat foods, such as: ? [employer]aked goods. ? Ice cream. ? Jamaica[place] toast. ? Sweet rolls. ? Pizza. ? Cheese bread. ? Foods covered with batter, butter, creamy sauces, or cheese. ? Fried foods. ? Sugary drinks and desserts.  Foods that cause gas or bloating This information is not intended to replace advice given to you by your health care provider. Make sure you discuss any questions you have with your health care provider. Document Released: 04/28/2013 Document Revised: 09/29/2015 Document Reviewed: 04/06/2013 Elsevier Interactive Patient Education  2017 [employer]

## 2017-05-13 ENCOUNTER — Encounter: Payer: Self-pay | Admitting: Surgery

## 2017-05-13 ENCOUNTER — Ambulatory Visit (INDEPENDENT_AMBULATORY_CARE_PROVIDER_SITE_OTHER): Payer: Medicare Other | Admitting: Surgery

## 2017-05-13 VITALS — BP 125/79 | HR 82 | Temp 98.2°F | Ht 64.0 in | Wt 225.4 lb

## 2017-05-13 DIAGNOSIS — K8020 Calculus of gallbladder without cholecystitis without obstruction: Secondary | ICD-10-CM | POA: Diagnosis not present

## 2017-05-13 DIAGNOSIS — K219 Gastro-esophageal reflux disease without esophagitis: Secondary | ICD-10-CM | POA: Diagnosis not present

## 2017-05-13 NOTE — Progress Notes (Signed)
05/13/2017  History of Present Illness: Alexis Weiss is a 32 y.o. female who presents as a follow up for abdominal pain in the setting of cholelithiasis and acid reflux.  She was unable to start her Prilosec treatment plan as prescribed on her last visit due to some social issues and family issues.  However she denies having any further abdominal pain since her last visit.  She denies any nausea or vomiting.  She also has not had any metallic taste as before, but she does report that she has been adhering to a low-fat diet.  Past Medical History: Past Medical History:  Diagnosis Date  . Anemia    with pregnancy- on Iron med  . Anxiety   . Depression   . Hypothyroid    on med- does not rmember name or dosage  . Miscarriage   . Morbid obesity with body mass index (BMI) of 45.0 to 49.9 in adult [facility](HCC)      Past Surgical History: Past Surgical History:  Procedure Laterality Date  . CESAREAN SECTION      Home Medications: Prior to Admission medications   Not on File    Allergies: No Known Allergies   Review of Systems: Review of Systems  Constitutional: Negative for chills and fever.  Respiratory: Negative for shortness of breath.   Cardiovascular: Negative for chest pain.  Gastrointestinal: Negative for abdominal pain, heartburn, nausea and vomiting.    Physical Exam BP 125/79   Pulse 82   Temp 98.2 F (36.8 C) (Oral)   Ht 5\' 4"  (1.626 m)   Wt 102.2 kg (225 lb 6.4 oz)   BMI 38.69 kg/m  CONSTITUTIONAL: No acute distress RESPIRATORY:  Lungs are clear, and breath sounds are equal bilaterally. Normal respiratory effort without pathologic use of accessory muscles. CARDIOVASCULAR: Heart is regular without murmurs, gallops, or rubs. GI: The abdomen is soft, nondistended, with mild discomfort to palpation in the epigastric area only.  Negative Murphy sign.  No pain in the right upper quadrant. NEUROLOGIC:  Motor and sensation is grossly normal.  Cranial nerves are grossly  intact. PSYCH:  Alert and oriented to person, place and time. Affect is normal.  Labs/Imaging: None recently  Assessment and Plan: This is a 32 y.o. female who presents as a follow-up for abdominal pain.  At this point the patient has not had any episodes of pain and denies any nausea or vomiting.  However she has not started her Prilosec therapy.  Discussed with her that she can start taking Prilosec either once daily or twice daily based on the dosage.  The patient did ask if she can take Zantac on top of that if necessary and reassured that she can take both if needed.  Since she has not had any episodes of right upper quadrant pain or nausea as before, at this point we can do watchful waiting instead of proceeding with surgery.  Discussed with the patient that if she has worsening symptoms or more episodes of there is no improvement that she can call us and set up a new appointment so that we can discuss surgery further.  However at this point will just do watchful waiting.  She understands this plan and all of her questions have been answered.  Face-to-face time spent with the patient and care providers was 25 minutes, with more than 50% of the time spent counseling, educating, and coordinating care of the patient.     Howie IllJose Luis Nneka Blanda, MD [facility]

## 2017-05-13 NOTE — Patient Instructions (Signed)
You may try Prilosec and Zantac over the counter. This can be purchased at [employer]  or any drug store.  Please call our office with any questions or concerns.

## 2017-09-24 ENCOUNTER — Encounter: Payer: Self-pay | Admitting: Emergency Medicine

## 2017-09-24 ENCOUNTER — Emergency Department
Admission: EM | Admit: 2017-09-24 | Discharge: 2017-09-24 | Disposition: A | Discharge status: Home / Self Care | Payer: Medicare Other | Attending: Emergency Medicine | Admitting: Emergency Medicine

## 2017-09-24 ENCOUNTER — Emergency Department: Payer: Medicare Other

## 2017-09-24 DIAGNOSIS — I10 Essential (primary) hypertension: Secondary | ICD-10-CM | POA: Insufficient documentation

## 2017-09-24 DIAGNOSIS — E039 Hypothyroidism, unspecified: Secondary | ICD-10-CM | POA: Diagnosis not present

## 2017-09-24 DIAGNOSIS — O26891 Other specified pregnancy related conditions, first trimester: Secondary | ICD-10-CM | POA: Insufficient documentation

## 2017-09-24 DIAGNOSIS — O26899 Other specified pregnancy related conditions, unspecified trimester: Secondary | ICD-10-CM

## 2017-09-24 DIAGNOSIS — Z3A01 Less than 8 weeks gestation of pregnancy: Secondary | ICD-10-CM | POA: Diagnosis not present

## 2017-09-24 DIAGNOSIS — R102 Pelvic and perineal pain: Secondary | ICD-10-CM | POA: Insufficient documentation

## 2017-09-24 HISTORY — DX: Calculus of gallbladder without cholecystitis without obstruction: K80.20

## 2017-09-24 LAB — COMPREHENSIVE METABOLIC PANEL
ALK PHOS: 74 U/L (ref 38–126)
ALT: 19 U/L (ref 14–54)
ANION GAP: 10 (ref 5–15)
AST: 27 U/L (ref 15–41)
Albumin: 4.1 g/dL (ref 3.5–5.0)
BILIRUBIN TOTAL: 0.4 mg/dL (ref 0.3–1.2)
BUN: 6 mg/dL (ref 6–20)
CALCIUM: 9 mg/dL (ref 8.9–10.3)
CO2: 22 mmol/L (ref 22–32)
CREATININE: 0.65 mg/dL (ref 0.44–1.00)
Chloride: 103 mmol/L (ref 101–111)
Glucose, Bld: 115 mg/dL — ABNORMAL HIGH (ref 65–99)
Potassium: 3.2 mmol/L — ABNORMAL LOW (ref 3.5–5.1)
SODIUM: 135 mmol/L (ref 135–145)
TOTAL PROTEIN: 7.8 g/dL (ref 6.5–8.1)

## 2017-09-24 LAB — URINALYSIS, COMPLETE (UACMP) WITH MICROSCOPIC
Bilirubin Urine: NEGATIVE
GLUCOSE, UA: NEGATIVE mg/dL
Hgb urine dipstick: NEGATIVE
KETONES UR: NEGATIVE mg/dL
Leukocytes, UA: NEGATIVE
NITRITE: NEGATIVE
PROTEIN: NEGATIVE mg/dL
Specific Gravity, Urine: 1.02 (ref 1.005–1.030)
pH: 5 (ref 5.0–8.0)

## 2017-09-24 LAB — CBC
HCT: 36 % (ref 35.0–47.0)
HEMOGLOBIN: 12.1 g/dL (ref 12.0–16.0)
MCH: 26.4 pg (ref 26.0–34.0)
MCHC: 33.7 g/dL (ref 32.0–36.0)
MCV: 78.5 fL — ABNORMAL LOW (ref 80.0–100.0)
Platelets: 219 10*3/uL (ref 150–440)
RBC: 4.58 MIL/uL (ref 3.80–5.20)
RDW: 14.5 % (ref 11.5–14.5)
WBC: 11.2 10*3/uL — AB (ref 3.6–11.0)

## 2017-09-24 LAB — LIPASE, BLOOD: Lipase: 23 U/L (ref 11–51)

## 2017-09-24 LAB — ABO/RH: ABO/RH(D): A POS

## 2017-09-24 LAB — HCG, QUANTITATIVE, PREGNANCY: hCG, Beta Chain, Quant, S: 6904 m[IU]/mL — ABNORMAL HIGH (ref <5)

## 2017-09-24 LAB — POCT PREGNANCY, URINE: Preg Test, Ur: POSITIVE — AB

## 2017-09-24 NOTE — ED Provider Notes (Addendum)
[facility] Emergency Department Provider Note  ____________________________________________   I have reviewed the triage vital signs and the nursing notes. Where available I have reviewed prior notes and, if possible and indicated, outside hospital notes.    HISTORY  Chief Complaint Abdominal Pain    HPI Alexis Weiss is a 32 y.o. female G3, P1, last menstrual period was middle of March but she has irregular periods so this is not going to tell the most she states, states that she is here because she had some mild suprapubic cramping and she realized she was pregnant yesterday and she has no idea exactly what her dates are.  No vaginal bleeding no nausea no vomiting no dysuria no urinary frequency no pain at this time, she has no prior history of surgeries, the pain is suprapubic when it is there, she has not felt it for several hours, seems to come and go.  She denies any vaginal discharge or STI exposure she would prefer not to have a pelvic exam if she can avoid it.  No radiation of the pain, nothing makes it better nothing makes it worse, comes and goes on its own.  Mild in severity, no other associated symptoms or prior treatment    Past Medical History:  Diagnosis Date  . Anemia    with pregnancy- on Iron med  . Anxiety   . Depression   . Gallstones   . Hypothyroid    on med- does not rmember name or dosage  . Miscarriage   . Morbid obesity with body mass index (BMI) of 45.0 to 49.9 in adult [facility])     Patient Active Problem List   Diagnosis Date Noted  . Anxiety 01/31/2016  . Depression 09/16/2015  . Depo-Provera contraceptive status 09/13/2015  . Tension headache, chronic 09/13/2015  . Right-sided low back pain without sciatica 09/13/2015  . Abnormal glucose level 04/14/2015  . Anemia affecting pregnancy 04/14/2015  . Cannabis abuse 04/14/2015  . Hypertension in pregnancy, preeclampsia, delivered 04/04/2015  . Morbid obesity (HCC)  04/03/2015  . Hypothyroidism 04/03/2015  . 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 Breast pain 09/09/2013    Past Surgical History:  Procedure Laterality Date  . CESAREAN SECTION      Prior to Admission medications   Not on File    Allergies Patient has no known allergies.  Family History  Problem Relation Age of Onset  . COPD Mother   . Asthma Mother   . Hypertension Father     Social History Social History   Tobacco Use  . Smoking status: Never Smoker  . Smokeless tobacco: Never Used  Substance Use Topics  . Alcohol use: No  . Drug use: No    Review of Systems Constitutional: No fever/chills Eyes: No visual changes. ENT: No sore throat. No stiff neck no neck pain Cardiovascular: Denies chest pain. Respiratory: Denies shortness of breath. Gastrointestinal:   no vomiting.  No diarrhea.  No constipation. Genitourinary: Negative for dysuria. Musculoskeletal: Negative lower extremity swelling Skin: Negative for rash. Neurological: Negative for severe headaches, focal weakness or numbness.   ____________________________________________   PHYSICAL EXAM:  VITAL SIGNS: ED Triage Vitals [09/24/17 1904]  Enc Vitals Group     BP 129/83     Pulse Rate 89     Resp 16     Temp 98.7 F (37.1 C)     Temp Source Oral     SpO2 100 %     Weight  215 lb (97.5 kg)     Height  (1.626 m)     Head Circumference      Peak Flow      Pain Score 5     Pain Loc      Pain Edu?      Excl. in GC?     Constitutional: Alert and oriented. Well appearing and in no acute distress. Eyes: Conjunctivae are normal Head: Atraumatic HEENT: No congestion/rhinnorhea. Mucous membranes are moist.  Oropharynx non-erythematous Neck:   Nontender with no meningismus, no masses, no stridor Cardiovascular: Normal rate, regular rhythm. Grossly normal heart sounds.  Good peripheral circulation. Respiratory: Normal respiratory effort.  No retractions. Lungs  CTAB. Abdominal: Soft and nontender. No distention. No guarding no rebound Back:  There is no focal tenderness or step off.  there is no midline tenderness there are no lesions noted. there is no CVA tenderness GU: Patient declines Musculoskeletal: No lower extremity tenderness, no upper extremity tenderness. No joint effusions, no DVT signs strong distal pulses no edema Neurologic:  Normal speech and language. No gross focal neurologic deficits are appreciated.  Skin:  Skin is warm, dry and intact. No rash noted. Psychiatric: Mood and affect are normal. Speech and behavior are normal.  ____________________________________________   LABS (all labs ordered are listed, but only abnormal results are displayed)  Labs Reviewed  COMPREHENSIVE METABOLIC PANEL - Abnormal; Notable for the following components:      Result Value   Potassium 3.2 (*)    Glucose, Bld 115 (*)    All other components within normal limits  CBC - Abnormal; Notable for the following components:   WBC 11.2 (*)    MCV 78.5 (*)    All other components within normal limits  URINALYSIS, COMPLETE (UACMP) WITH MICROSCOPIC - Abnormal; Notable for the following components:   Color, Urine YELLOW (*)    APPearance HAZY (*)    Bacteria, UA RARE (*)    All other components within normal limits  HCG, QUANTITATIVE, PREGNANCY - Abnormal; Notable for the following components:   hCG, Beta Chain, Quant, S 6,904 (*)    All other components within normal limits  POCT PREGNANCY, URINE - Abnormal; Notable for the following components:   Preg Test, Ur POSITIVE (*)    All other components within normal limits  LIPASE, BLOOD  POC URINE PREG, ED  ABO/RH    Pertinent labs  results that were available during my care of the patient were reviewed by me and considered in my medical decision making (see chart for details). ____________________________________________  EKG  I personally interpreted any EKGs ordered by me or  triage  ____________________________________________  RADIOLOGY  Pertinent labs & imaging results that were available during my care of the patient were reviewed by me and considered in my medical decision making (see chart for details). If possible, patient and/or family made aware of any abnormal findings.  US Ob Comp Less 14 Wks  Result Date: 09/24/2017 CLINICAL DATA:  Pelvic pain, pregnant patient EXAM: OBSTETRIC <14 WK [place] AND TRANSVAGINAL OB US TECHNIQUE: Both transabdominal and transvaginal ultrasound examinations were performed for complete evaluation of the gestation as well as the maternal uterus, adnexal regions, and pelvic cul-de-sac. Transvaginal technique was performed to assess early pregnancy. COMPARISON:  None. FINDINGS: Intrauterine gestational sac: Single intrauterine gestational sac Yolk sac:  Visible Embryo:  Not visible MSD: 8.1 mm   5 w   4 d Subchorionic hemorrhage:  None visualized. Maternal uterus/adnexae: Left ovary  measures 3.2 x 2.4 x 2 cm. The right ovary measures 4.3 x 2.4 x 2.8 cm. Corpus luteal body in the right ovary measuring 2.6 cm. Trace free fluid IMPRESSION: 1. Single intrauterine pregnancy with visible gestational sac and yolk sac but no embryo. Consider follow-up ultrasound in 10-14 days to confirm viability. 2. Trace free fluid in the pelvis Electronically Signed   By: Jasmine Pang M.D.   On: 09/24/2017 20:25   US Ob Transvaginal  Result Date: 09/24/2017 CLINICAL DATA:  Pelvic pain, pregnant patient EXAM: OBSTETRIC <14 WK [place] AND TRANSVAGINAL OB US TECHNIQUE: Both transabdominal and transvaginal ultrasound examinations were performed for complete evaluation of the gestation as well as the maternal uterus, adnexal regions, and pelvic cul-de-sac. Transvaginal technique was performed to assess early pregnancy. COMPARISON:  None. FINDINGS: Intrauterine gestational sac: Single intrauterine gestational sac Yolk sac:  Visible Embryo:  Not visible MSD: 8.1 mm   5 w   4  d Subchorionic hemorrhage:  None visualized. Maternal uterus/adnexae: Left ovary measures 3.2 x 2.4 x 2 cm. The right ovary measures 4.3 x 2.4 x 2.8 cm. Corpus luteal body in the right ovary measuring 2.6 cm. Trace free fluid IMPRESSION: 1. Single intrauterine pregnancy with visible gestational sac and yolk sac but no embryo. Consider follow-up ultrasound in 10-14 days to confirm viability. 2. Trace free fluid in the pelvis Electronically Signed   By: Jasmine Pang M.D.   On: 09/24/2017 20:25   ____________________________________________    PROCEDURES  Procedure(s) performed: None  Procedures  Critical Care performed: None  ____________________________________________   INITIAL IMPRESSION / ASSESSMENT AND PLAN / ED COURSE  Pertinent labs & imaging results that were available during my care of the patient were reviewed by me and considered in my medical decision making (see chart for details).  Patient here with very early pregnancy she is Rh+, ultrasound does not show ectopic pregnancy abdomen is completely benign.  Nothing to suggest, there is a cholecystitis appendicitis ectopic pregnancy ovarian cyst of any significance, torsion, PID etc.  Patient declines pelvic exam, she understands limitations this places upon me in terms of working up her abdominal pain however, she is very satisfied to know that she likely has an IUP, she will follow-up with [facility].  She understands very extensive return precautions including for eating, pain fever vomiting etc.  ----------------------------------------- 9:10 PM on 09/24/2017 -----------------------------------------  Serial abdominal exams are completely benign with no tenderness and patient declines pelvic exam    ____________________________________________   FINAL CLINICAL IMPRESSION(S) / ED DIAGNOSES  Final diagnoses:  None      This chart was dictated using voice recognition software.  Despite best efforts to proofread,  errors  can occur which can change meaning.   3   Jeanmarie Plant, MD 09/24/17 2109    Jeanmarie Plant, MD 09/24/17 2110

## 2017-09-24 NOTE — Discharge Instructions (Addendum)
If you have increased pain, fever, vomiting, lightheadedness, significant bleeding of more than a pad an hour, or you feel worse in any way, please return to the emergency department.

## 2017-09-24 NOTE — ED Triage Notes (Signed)
Patient presents to the ED with right lower quadrant abdominal pain that began around 4pm.  Patient states she found out she was pregnant yesterday with home pregnancy tests.  Patient is approx. [redacted] weeks pregnant based on her lmp.  Patient is in no obvious distress at this time.

## 2017-10-04 ENCOUNTER — Emergency Department
Admission: EM | Admit: 2017-10-04 | Discharge: 2017-10-04 | Disposition: A | Discharge status: Home / Self Care | Payer: Medicare Other | Attending: Emergency Medicine | Admitting: Emergency Medicine

## 2017-10-04 ENCOUNTER — Other Ambulatory Visit: Payer: Self-pay

## 2017-10-04 ENCOUNTER — Emergency Department: Payer: Medicare Other

## 2017-10-04 ENCOUNTER — Encounter: Payer: Self-pay | Admitting: Emergency Medicine

## 2017-10-04 DIAGNOSIS — O26891 Other specified pregnancy related conditions, first trimester: Secondary | ICD-10-CM | POA: Diagnosis present

## 2017-10-04 DIAGNOSIS — E039 Hypothyroidism, unspecified: Secondary | ICD-10-CM | POA: Insufficient documentation

## 2017-10-04 DIAGNOSIS — R102 Pelvic and perineal pain: Secondary | ICD-10-CM

## 2017-10-04 DIAGNOSIS — R1030 Lower abdominal pain, unspecified: Secondary | ICD-10-CM | POA: Diagnosis not present

## 2017-10-04 DIAGNOSIS — Z3A01 Less than 8 weeks gestation of pregnancy: Secondary | ICD-10-CM | POA: Diagnosis not present

## 2017-10-04 DIAGNOSIS — R109 Unspecified abdominal pain: Secondary | ICD-10-CM

## 2017-10-04 DIAGNOSIS — O26899 Other specified pregnancy related conditions, unspecified trimester: Secondary | ICD-10-CM

## 2017-10-04 LAB — URINALYSIS, COMPLETE (UACMP) WITH MICROSCOPIC
BACTERIA UA: NONE SEEN
BILIRUBIN URINE: NEGATIVE
GLUCOSE, UA: NEGATIVE mg/dL
HGB URINE DIPSTICK: NEGATIVE
Ketones, ur: NEGATIVE mg/dL
LEUKOCYTES UA: NEGATIVE
NITRITE: NEGATIVE
PROTEIN: NEGATIVE mg/dL
SPECIFIC GRAVITY, URINE: 1.023 (ref 1.005–1.030)
pH: 5 (ref 5.0–8.0)

## 2017-10-04 LAB — CBC
HEMATOCRIT: 36.9 % (ref 35.0–47.0)
Hemoglobin: 12.4 g/dL (ref 12.0–16.0)
MCH: 26.6 pg (ref 26.0–34.0)
MCHC: 33.7 g/dL (ref 32.0–36.0)
MCV: 78.9 fL — AB (ref 80.0–100.0)
Platelets: 194 10*3/uL (ref 150–440)
RBC: 4.67 MIL/uL (ref 3.80–5.20)
RDW: 15 % — AB (ref 11.5–14.5)
WBC: 8.5 10*3/uL (ref 3.6–11.0)

## 2017-10-04 LAB — COMPREHENSIVE METABOLIC PANEL
ALT: 24 U/L (ref 14–54)
ANION GAP: 9 (ref 5–15)
AST: 25 U/L (ref 15–41)
Albumin: 4 g/dL (ref 3.5–5.0)
Alkaline Phosphatase: 73 U/L (ref 38–126)
BILIRUBIN TOTAL: 0.4 mg/dL (ref 0.3–1.2)
BUN: 9 mg/dL (ref 6–20)
CHLORIDE: 103 mmol/L (ref 101–111)
CO2: 22 mmol/L (ref 22–32)
Calcium: 9 mg/dL (ref 8.9–10.3)
Creatinine, Ser: 0.62 mg/dL (ref 0.44–1.00)
Glucose, Bld: 103 mg/dL — ABNORMAL HIGH (ref 65–99)
POTASSIUM: 3.7 mmol/L (ref 3.5–5.1)
Sodium: 134 mmol/L — ABNORMAL LOW (ref 135–145)
Total Protein: 7.8 g/dL (ref 6.5–8.1)

## 2017-10-04 LAB — POCT PREGNANCY, URINE: Preg Test, Ur: POSITIVE — AB

## 2017-10-04 LAB — HCG, QUANTITATIVE, PREGNANCY: hCG, Beta Chain, Quant, S: 29457 m[IU]/mL — ABNORMAL HIGH (ref <5)

## 2017-10-04 NOTE — ED Triage Notes (Signed)
G3P1.  Pt c/o lower abdominal cramping across entire lower abdomen.  No vaginal bleeding.  Denies NVD.  Pt is [redacted] week pregnant. Reports her OB won't see her until further along and would like to be seen today for abdominal pain and referred to [facility].

## 2017-10-04 NOTE — ED Notes (Signed)
[place] called RN to request hCG add on for pt before [place] can be completed. Order placed per verbal order from MD and lab confirmed that there is enough blood for add on.

## 2017-10-04 NOTE — ED Provider Notes (Signed)
[facility] Emergency Department Provider Note ____________________________________________   First MD Initiated Contact with Patient 10/04/17 1254     (approximate)  I have reviewed the triage vital signs and the nursing notes.   HISTORY  Chief Complaint Abdominal Pain    HPI Alexis Weiss is a 32 y.o. female with PMH as noted below who is a G3, P1 at approximately [redacted] weeks pregnant by dates who presents with lower abdominal crampy pain for the last several days, intermittent, and not associated with vomiting, diarrhea, vaginal bleeding, or urinary symptoms.  Patient states that she has not been able to get follow-up with her OB/GYN yet.   Past Medical History:  Diagnosis Date  . Anemia    with pregnancy- on Iron med  . Anxiety   . Depression   . Gallstones   . Hypothyroid    on med- does not rmember name or dosage  . Miscarriage   . Morbid obesity with body mass index (BMI) of 45.0 to 49.9 in adult [facility])     Patient Active Problem List   Diagnosis Date Noted  . Anxiety 01/31/2016  . Depression 09/16/2015  . Depo-Provera contraceptive status 09/13/2015  . Tension headache, chronic 09/13/2015  . Right-sided low back pain without sciatica 09/13/2015  . Abnormal glucose level 04/14/2015  . Anemia affecting pregnancy 04/14/2015  . Cannabis abuse 04/14/2015  . Hypertension in pregnancy, preeclampsia, delivered 04/04/2015  . Morbid obesity (HCC) 04/03/2015  . Hypothyroidism 04/03/2015  . 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 Breast pain 09/09/2013    Past Surgical History:  Procedure Laterality Date  . CESAREAN SECTION      Prior to Admission medications   Not on File    Allergies Patient has no known allergies.  Family History  Problem Relation Age of Onset  . COPD Mother   . Asthma Mother   . Hypertension Father     Social History Social History   Tobacco Use  . Smoking status: Never Smoker  . Smokeless  tobacco: Never Used  Substance Use Topics  . Alcohol use: No  . Drug use: No    Review of Systems  Constitutional: No fever. Eyes: No redness. ENT: No sore throat. Cardiovascular: Denies chest pain. Respiratory: Denies shortness of breath. Gastrointestinal: No vomiting. Genitourinary: Negative for dysuria.  Musculoskeletal: Negative for back pain. Skin: Negative for rash. Neurological: Negative for headaches.   ____________________________________________   PHYSICAL EXAM:  VITAL SIGNS: ED Triage Vitals  Enc Vitals Group     BP 10/04/17 1011 120/82     Pulse Rate 10/04/17 1011 76     Resp 10/04/17 1011 18     Temp 10/04/17 1011 98.4 F (36.9 C)     Temp Source 10/04/17 1011 Oral     SpO2 10/04/17 1011 99 %     Weight 10/04/17 1013 213 lb (96.6 kg)     Height 10/04/17 1013  (1.626 m)     Head Circumference --      Peak Flow --      Pain Score 10/04/17 1012 8     Pain Loc --      Pain Edu? --      Excl. in GC? --     Constitutional: Alert and oriented. Well appearing and in no acute distress. Eyes: Conjunctivae are normal.  Head: Atraumatic. Nose: No congestion/rhinnorhea. Mouth/Throat: Mucous membranes are moist.   Neck: Normal range of motion.  Cardiovascular: Good peripheral circulation.  Respiratory: Normal respiratory effort.  Gastrointestinal: Soft and nontender.  Mild suprapubic discomfort.  No distention.  Genitourinary: No CVA tenderness. Musculoskeletal:  Extremities warm and well perfused.  Neurologic:  Normal speech and language. No gross focal neurologic deficits are appreciated.  Skin:  Skin is warm and dry. No rash noted. Psychiatric: Mood and affect are normal. Speech and behavior are normal.  ____________________________________________   LABS (all labs ordered are listed, but only abnormal results are displayed)  Labs Reviewed  COMPREHENSIVE METABOLIC PANEL - Abnormal; Notable for the following components:      Result Value    Sodium 134 (*)    Glucose, Bld 103 (*)    All other components within normal limits  CBC - Abnormal; Notable for the following components:   MCV 78.9 (*)    RDW 15.0 (*)    All other components within normal limits  URINALYSIS, COMPLETE (UACMP) WITH MICROSCOPIC - Abnormal; Notable for the following components:   Color, Urine YELLOW (*)    APPearance CLEAR (*)    All other components within normal limits  HCG, QUANTITATIVE, PREGNANCY - Abnormal; Notable for the following components:   hCG, Beta Chain, Quant, S 29,457 (*)    All other components within normal limits  POCT PREGNANCY, URINE - Abnormal; Notable for the following components:   Preg Test, Ur POSITIVE (*)    All other components within normal limits  POC URINE PREG, ED   ____________________________________________  EKG   ____________________________________________  RADIOLOGY  US pelvis transvaginal: Live intrauterine gestation, 7 weeks 0 days  ____________________________________________   PROCEDURES  Procedure(s) performed: No  Procedures  Critical Care performed: No ____________________________________________   INITIAL IMPRESSION / ASSESSMENT AND PLAN / ED COURSE  Pertinent labs & imaging results that were available during my care of the patient were reviewed by me and considered in my medical decision making (see chart for details).  32 year old female G3, P1 at approximately 7 weeks by dates presents with lower abdominal crampy pain over the last several days.  I reviewed the past medical records in Epic; the patient was seen 10 days ago for cramping with ultrasound at that time showing gestational sac and yolk sac but no fetal pole yet.  On exam, the patient is comfortable appearing, vital signs are normal, the abdomen is soft and nontender, and there are no other acute findings.  Differential includes pregnancy related pain such as round ligament pain, miscarriage, intestinal cramping or other  benign cause.  Will obtain labs, pelvic ultrasound, hCG, and reassess.  ----------------------------------------- 3:50 PM on 10/04/2017 -----------------------------------------  Ultrasound shows live intrauterine gestation at 7 weeks.  Patient's lab work-up and UA are unremarkable.  She remains comfortable appearing.  She is stable for discharge home at this time.  Return precautions given, and she expresses understanding. ____________________________________________   FINAL CLINICAL IMPRESSION(S) / ED DIAGNOSES  Final diagnoses:  Abdominal pain during pregnancy in first trimester      NEW MEDICATIONS STARTED DURING THIS VISIT:  New Prescriptions   No medications on file     Note:  This document was prepared using Dragon voice recognition software and may include unintentional dictation errors.    Dionne Bucy, MD 10/04/17 1550

## 2017-10-08 ENCOUNTER — Encounter: Payer: Self-pay | Admitting: Emergency Medicine

## 2017-10-08 ENCOUNTER — Emergency Department
Admission: EM | Admit: 2017-10-08 | Discharge: 2017-10-08 | Disposition: A | Discharge status: Home / Self Care | Payer: Medicare Other | Attending: Emergency Medicine | Admitting: Emergency Medicine

## 2017-10-08 DIAGNOSIS — O99281 Endocrine, nutritional and metabolic diseases complicating pregnancy, first trimester: Secondary | ICD-10-CM | POA: Diagnosis not present

## 2017-10-08 DIAGNOSIS — R079 Chest pain, unspecified: Secondary | ICD-10-CM | POA: Diagnosis not present

## 2017-10-08 DIAGNOSIS — E039 Hypothyroidism, unspecified: Secondary | ICD-10-CM | POA: Insufficient documentation

## 2017-10-08 DIAGNOSIS — O210 Mild hyperemesis gravidarum: Secondary | ICD-10-CM | POA: Diagnosis present

## 2017-10-08 DIAGNOSIS — Z3A08 8 weeks gestation of pregnancy: Secondary | ICD-10-CM | POA: Diagnosis not present

## 2017-10-08 DIAGNOSIS — O9989 Other specified diseases and conditions complicating pregnancy, childbirth and the puerperium: Secondary | ICD-10-CM | POA: Insufficient documentation

## 2017-10-08 DIAGNOSIS — R0789 Other chest pain: Secondary | ICD-10-CM

## 2017-10-08 LAB — URINALYSIS, COMPLETE (UACMP) WITH MICROSCOPIC
BACTERIA UA: NONE SEEN
BILIRUBIN URINE: NEGATIVE
Glucose, UA: NEGATIVE mg/dL
Hgb urine dipstick: NEGATIVE
KETONES UR: 20 mg/dL — AB
LEUKOCYTES UA: NEGATIVE
Nitrite: NEGATIVE
PROTEIN: 30 mg/dL — AB
Specific Gravity, Urine: 1.023 (ref 1.005–1.030)
pH: 5 (ref 5.0–8.0)

## 2017-10-08 LAB — CBC
HEMATOCRIT: 39.2 % (ref 35.0–47.0)
Hemoglobin: 13.3 g/dL (ref 12.0–16.0)
MCH: 26.2 pg (ref 26.0–34.0)
MCHC: 33.8 g/dL (ref 32.0–36.0)
MCV: 77.6 fL — AB (ref 80.0–100.0)
PLATELETS: 223 10*3/uL (ref 150–440)
RBC: 5.06 MIL/uL (ref 3.80–5.20)
RDW: 14.8 % — ABNORMAL HIGH (ref 11.5–14.5)
WBC: 10.8 10*3/uL (ref 3.6–11.0)

## 2017-10-08 LAB — COMPREHENSIVE METABOLIC PANEL
ALT: 32 U/L (ref 14–54)
AST: 31 U/L (ref 15–41)
Albumin: 4.2 g/dL (ref 3.5–5.0)
Alkaline Phosphatase: 75 U/L (ref 38–126)
Anion gap: 11 (ref 5–15)
BUN: 9 mg/dL (ref 6–20)
CHLORIDE: 101 mmol/L (ref 101–111)
CO2: 22 mmol/L (ref 22–32)
CREATININE: 0.65 mg/dL (ref 0.44–1.00)
Calcium: 9.5 mg/dL (ref 8.9–10.3)
GFR calc non Af Amer: >60 mL/min (ref >60)
Glucose, Bld: 124 mg/dL — ABNORMAL HIGH (ref 65–99)
Potassium: 3.8 mmol/L (ref 3.5–5.1)
SODIUM: 134 mmol/L — AB (ref 135–145)
Total Bilirubin: 0.7 mg/dL (ref 0.3–1.2)
Total Protein: 8.2 g/dL — ABNORMAL HIGH (ref 6.5–8.1)

## 2017-10-08 LAB — HCG, QUANTITATIVE, PREGNANCY: HCG, BETA CHAIN, QUANT, S: 59088 m[IU]/mL — AB (ref <5)

## 2017-10-08 LAB — TROPONIN I

## 2017-10-08 LAB — LIPASE, BLOOD: LIPASE: 24 U/L (ref 11–51)

## 2017-10-08 LAB — POCT PREGNANCY, URINE: PREG TEST UR: POSITIVE — AB

## 2017-10-08 MED ORDER — PROMETHAZINE HCL 25 MG/ML IJ SOLN
12.5000 mg | Freq: Once | INTRAMUSCULAR | Status: AC
  Administered 2017-10-08: 12.5 mg via INTRAVENOUS
  Filled 2017-10-08: qty 1

## 2017-10-08 MED ORDER — DEXTROSE 5 % AND 0.45 % NACL IV BOLUS
1000.0000 mL | Freq: Once | INTRAVENOUS | Status: AC
  Administered 2017-10-08: 1000 mL via INTRAVENOUS
  Filled 2017-10-08: qty 1000

## 2017-10-08 NOTE — ED Provider Notes (Signed)
[facility] Emergency Department Provider Note  ____________________________________________   First MD Initiated Contact with Patient 10/08/17 2054     (approximate)  I have reviewed the triage vital signs and the nursing notes.   HISTORY  Chief Complaint Emesis During Pregnancy and Chest Pain   HPI Alexis Weiss is a 32 y.o. female with a history of hyperemesis as well as preeclampsia who is presenting to the emergency department today at approximately 8 weeks of gestation with 4 days of nausea and vomiting.  She says that she has vomited over 20 times per day and the vomitus is nonbilious.  She denies any abdominal pain.  However, she does say that she has a pressure-like, cramping chest pain across the front of her chest.  It is nonradiating.  She is not having any shortness of breath.  Says the pain is worse with the vomiting.  Said that she is directly just during her previous pregnancy with relief of her symptoms.  Denies any burning with urination.  Denies any vaginal bleeding or discharge.  Past Medical History:  Diagnosis Date  . Anemia    with pregnancy- on Iron med  . Anxiety   . Depression   . Gallstones   . Hypothyroid    on med- does not rmember name or dosage  . Miscarriage   . Morbid obesity with body mass index (BMI) of 45.0 to 49.9 in adult [facility])     Patient Active Problem List   Diagnosis Date Noted  . Anxiety 01/31/2016  . Depression 09/16/2015  . Depo-Provera contraceptive status 09/13/2015  . Tension headache, chronic 09/13/2015  . Right-sided low back pain without sciatica 09/13/2015  . Abnormal glucose level 04/14/2015  . Anemia affecting pregnancy 04/14/2015  . Cannabis abuse 04/14/2015  . Hypertension in pregnancy, preeclampsia, delivered 04/04/2015  . Morbid obesity (HCC) 04/03/2015  . Hypothyroidism 04/03/2015  . 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 Breast pain 09/09/2013    Past Surgical  History:  Procedure Laterality Date  . CESAREAN SECTION      Prior to Admission medications   Not on File    Allergies Patient has no known allergies.  Family History  Problem Relation Age of Onset  . COPD Mother   . Asthma Mother   . Hypertension Father     Social History Social History   Tobacco Use  . Smoking status: Never Smoker  . Smokeless tobacco: Never Used  Substance Use Topics  . Alcohol use: No  . Drug use: No    Review of Systems  Constitutional: No fever/chills Eyes: No visual changes. ENT: No sore throat. Cardiovascular: Denies chest pain. Respiratory: Denies shortness of breath. Gastrointestinal: No abdominal pain.   No diarrhea.  No constipation. Genitourinary: Negative for dysuria. Musculoskeletal: Negative for back pain. Skin: Negative for rash. Neurological: Negative for headaches, focal weakness or numbness.   ____________________________________________   PHYSICAL EXAM:  VITAL SIGNS: ED Triage Vitals  Enc Vitals Group     BP 10/08/17 1834 118/78     Pulse Rate 10/08/17 1834 86     Resp 10/08/17 1834 18     Temp 10/08/17 1834 98.6 F (37 C)     Temp Source 10/08/17 1834 Oral     SpO2 10/08/17 1834 97 %     Weight 10/08/17 1835 202 lb (91.6 kg)     Height 10/08/17 1835 5\' 4"  (1.626 m)     Head Circumference --  Peak Flow --      Pain Score 10/08/17 1835 10     Pain Loc --      Pain Edu? --      Excl. in GC? --     Constitutional: Alert and oriented.  Patiently actively retching in the room.  Green vomitus in the vomitus bag. Eyes: Conjunctivae are normal.  Head: Atraumatic. Nose: No congestion/rhinnorhea. Mouth/Throat: Mucous membranes are moist.  Neck: No stridor.   Cardiovascular: Normal rate, regular rhythm. Grossly normal heart sounds.  Chest pain reproducible to palpation across the front of the chest. Respiratory: Normal respiratory effort.  No retractions. Lungs CTAB. Gastrointestinal: Soft and nontender. No  distention.  Musculoskeletal: No lower extremity tenderness nor edema.  No joint effusions. Neurologic:  Normal speech and language. No gross focal neurologic deficits are appreciated. Skin:  Skin is warm, dry and intact. No rash noted. Psychiatric: Mood and affect are normal. Speech and behavior are normal.  ____________________________________________   LABS (all labs ordered are listed, but only abnormal results are displayed)  Labs Reviewed  COMPREHENSIVE METABOLIC PANEL - Abnormal; Notable for the following components:      Result Value   Sodium 134 (*)    Glucose, Bld 124 (*)    Total Protein 8.2 (*)    All other components within normal limits  CBC - Abnormal; Notable for the following components:   MCV 77.6 (*)    RDW 14.8 (*)    All other components within normal limits  URINALYSIS, COMPLETE (UACMP) WITH MICROSCOPIC - Abnormal; Notable for the following components:   Color, Urine AMBER (*)    APPearance HAZY (*)    Ketones, ur 20 (*)    Protein, ur 30 (*)    All other components within normal limits  HCG, QUANTITATIVE, PREGNANCY - Abnormal; Notable for the following components:   hCG, Beta Chain, Quant, S 59,088 (*)    All other components within normal limits  POCT PREGNANCY, URINE - Abnormal; Notable for the following components:   Preg Test, Ur POSITIVE (*)    All other components within normal limits  LIPASE, BLOOD  TROPONIN I  POC URINE PREG, ED   ____________________________________________  EKG  ED ECG REPORT I, Arelia Longestavid M Izaan Kingbird, the attending physician, personally viewed and interpreted this ECG.   Date: 10/08/2017  EKG Time: 1833  Rate: 89  Rhythm: normal sinus rhythm  Axis: Normal  Intervals:none  ST&T Change: No ST elevation or depression.  No abnormal T wave inversion.  ____________________________________________  RADIOLOGY   ____________________________________________   PROCEDURES  Procedure(s) performed:    Procedures  Critical Care performed:   ____________________________________________   INITIAL IMPRESSION / ASSESSMENT AND PLAN / ED COURSE  Pertinent labs & imaging results that were available during my care of the patient were reviewed by me and considered in my medical decision making (see chart for details).  DDX: Hyperemesis gravidarum, musculo skeletal chest pain, dehydration, less light abnormality As part of my medical decision making, I reviewed the following data within the electronic MEDICAL RECORD NUMBER Notes from prior ED visits  ----------------------------------------- 10:47 PM on 10/08/2017 -----------------------------------------  Patient tolerating crackers and p.o. fluids at this time.  No longer vomiting.  Finished fluids and received 1 dose of Phenergan IV.  Patient to discuss p.o. medication about 8:30 AM with her OB/GYN.  Because she is having follow-up so soon I will not be giving her prescription so she may discuss further care with her OB/GYN in the morning.  She is understanding the treatment plan willing to comply. ____________________________________________   FINAL CLINICAL IMPRESSION(S) / ED DIAGNOSES  Hyperemesis gravidarum.  Chest wall pain.    NEW MEDICATIONS STARTED DURING THIS VISIT:  New Prescriptions   No medications on file     Note:  This document was prepared using Dragon voice recognition software and may include unintentional dictation errors.     Myrna Blazer, MD 10/08/17 [id number]

## 2017-10-08 NOTE — ED Triage Notes (Signed)
Patient presents to the ED with nausea and vomiting and chest pain with pregnancy.  Patient is approx. [redacted] weeks pregnant.  Patient has an appointment with OB-GYN tomorrow.  Patient reports vomiting >20 times in the past 24 hours.  Patient reports diarrhea >10 times in the past 24 hours.  Patient states diarrhea began on Saturday.  Patient states, "It feels like an elephant is sitting on my chest."  Patient states chest pain began several days ago, worse this am.

## 2017-10-08 NOTE — ED Notes (Signed)
Pt states 4 days ago having freq episodes of emesis, pt states "20 times a day." Pt states she "can't eat or drink anything without it coming back up." Pt states last pregnancy she had the same problem as well as pre-eclampsia. Pt states she will be seeing [facility] at 8:30am tomorrow. Pt coughing during assessment, has emesis bag in front of her but no emesis at this time. Pt states she has episodes of "hot then cold" but denies fever. Pt denies discharge.

## 2017-10-08 NOTE — ED Notes (Signed)
Pt able to tolerate crackers and water given by EDP

## 2017-10-08 NOTE — ED Notes (Signed)

## 2017-10-09 ENCOUNTER — Ambulatory Visit (INDEPENDENT_AMBULATORY_CARE_PROVIDER_SITE_OTHER): Payer: Medicaid Other | Admitting: Obstetrics and Gynecology

## 2017-10-09 ENCOUNTER — Telehealth: Payer: Self-pay | Admitting: Obstetrics and Gynecology

## 2017-10-09 ENCOUNTER — Encounter: Payer: Self-pay | Admitting: Obstetrics and Gynecology

## 2017-10-09 VITALS — BP 106/70 | HR 100 | Ht 64.0 in | Wt 207.0 lb

## 2017-10-09 DIAGNOSIS — O219 Vomiting of pregnancy, unspecified: Secondary | ICD-10-CM

## 2017-10-09 MED ORDER — DOXYLAMINE-PYRIDOXINE ER 20-20 MG PO TBCR: 1.0000 | EXTENDED_RELEASE_TABLET | Freq: Two times a day (BID) | ORAL | 2 refills | Status: DC

## 2017-10-09 MED ORDER — PANTOPRAZOLE SODIUM 40 MG PO TBEC: 40.0000 mg | DELAYED_RELEASE_TABLET | Freq: Every day | ORAL | 2 refills | Status: DC

## 2017-10-09 NOTE — Telephone Encounter (Signed)
Patient is calling new to the prescription she was give at today's visit for nausea. Patient reports vomiting the pill back up and still can't keep any thing down. Please advise

## 2017-10-09 NOTE — Progress Notes (Signed)
Obstetrics & Gynecology Office Visit   Chief Complaint  Patient presents with  . Follow-up    ER F/U for dehydration/Early OB    History of Present Illness: 32 y.o. Alexis Weiss whose at about [redacted] weeks gestation by 7 week ultrasound who presents in follow up from an ER visit. She presents in follow up from an ER visit yesterday. She had been unable to keep any food down for the past four days. In the ER she received IV fluids and IV phenergan and IV Zofran. She was able to keep down some crackers and fluids in the ER. This morning she drank some water and it came back up.  She has lost 13 pounds so far with this pregnancy. Her last pregnancy she had similar issues.  Her last pregnancy was complicated by severe preeclampsia and she was delivered at 32 weeks [facility]. Her baby is doing well, though he has autism. She denies vaginal bleeding.     Past Medical History:  Diagnosis Date  . Anemia    with pregnancy- on Iron med  . Anxiety   . Depression   . Gallstones   . Hypothyroid    on med- does not rmember name or dosage  . Miscarriage   . Morbid obesity with body mass index (BMI) of 45.0 to 49.9 in adult [facility](HCC)     Past Surgical History:  Procedure Laterality Date  . CESAREAN SECTION      Gynecologic History: Patient's last menstrual period was 08/14/2017 (approximate).  Obstetric History: [id number]  Family History  Problem Relation Age of Onset  . COPD Mother   . Asthma Mother   . Hypertension Father     Social History   Socioeconomic History  . Marital status: Single    Spouse name: Not on file  . Number of children: Not on file  . Years of education: Not on file  . Highest education level: Not on file  Occupational History  . Not on file  Social Needs  . Financial resource strain: Not on file  . Food insecurity:    Worry: Not on file    Inability: Not on file  . Transportation needs:    Medical: Not on file    Non-medical: Not on file    Tobacco Use  . Smoking status: Never Smoker  . Smokeless tobacco: Never Used  Substance and Sexual Activity  . Alcohol use: No  . Drug use: No  . Sexual activity: Yes    Birth control/protection: None  Lifestyle  . Physical activity:    Days per week: Not on file    Minutes per session: Not on file  . Stress: Not on file  Relationships  . Social connections:    Talks on phone: Not on file    Gets together: Not on file    Attends religious service: Not on file    Active member of club or organization: Not on file    Attends meetings of clubs or organizations: Not on file    Relationship status: Not on file  . Intimate partner violence:    Fear of current or ex partner: Not on file    Emotionally abused: Not on file    Physically abused: Not on file    Forced sexual activity: Not on file  Other Topics Concern  . Not on file  Social History Narrative  . Not on file    No Known Allergies  Prior to Admission medications  Medication Sig Start Date End Date Taking? Authorizing Provider  Prenatal Vit-Fe Fumarate-FA (MULTIVITAMIN-PRENATAL) 27-0.8 MG TABS tablet Take 1 tablet by mouth daily at 12 noon.   Yes [provider]    Review of Systems  Constitutional: Positive for malaise/fatigue and weight loss. Negative for chills, diaphoresis and fever.  HENT: Negative.   Eyes: Negative.   Respiratory: Negative.   Cardiovascular: Negative.   Gastrointestinal: Positive for nausea and vomiting. Negative for abdominal pain, blood in stool, constipation, diarrhea, heartburn and melena.  Genitourinary: Negative.   Musculoskeletal: Negative.   Skin: Negative.   Neurological: Negative.   Psychiatric/Behavioral: Negative.      Physical Exam BP 106/70 (BP Location: Left Arm, Patient Position: Sitting, Cuff Size: Large)   Pulse 100   Ht 5\' 4"  (1.626 m)   Wt 207 lb (93.9 kg)   LMP 08/14/2017 (Approximate)   BMI 35.53 kg/m  Patient's last menstrual period was 08/14/2017  (approximate). Physical Exam  Constitutional: She is oriented to person, place, and time. She appears well-developed and well-nourished. No distress.  Eyes: EOM are normal. No scleral icterus.  Neck: Normal range of motion. Neck supple. No thyromegaly present.  Cardiovascular: Normal rate and regular rhythm.  Pulmonary/Chest: Effort normal and breath sounds normal. No respiratory distress. She has no wheezes. She has no rales.  Abdominal: Soft. Bowel sounds are normal. She exhibits no distension and no mass. There is no tenderness. There is no rebound and no guarding.  Musculoskeletal: Normal range of motion. She exhibits no edema.  Lymphadenopathy:    She has no cervical adenopathy.  Neurological: She is alert and oriented to person, place, and time. No cranial nerve deficit.  Skin: Skin is warm and dry. No erythema.  Psychiatric: She has a normal mood and affect. Her behavior is normal. Judgment normal.    Female chaperone present for pelvic and breast  portions of the physical exam  Assessment: 32 y.o. [id number] female here for  1. Nausea and vomiting during pregnancy      Plan: Problem List Items Addressed This Visit    None    Visit Diagnoses    Nausea and vomiting during pregnancy    -  Primary   Relevant Medications   Doxylamine-Pyridoxine ER (BONJESTA) 20-20 MG TBCR   pantoprazole (PROTONIX) 40 MG tablet     Will aggressively work to get her nausea under control. Close follow-up recommended. Will get her started in OB pregnancy care soon.  Will have her follow up in a couple of days to assess her symptoms. Samples for Bonjesta given. Rx for protonix given, as well.  Precautions to go to ER, if on a weekend and office closed, if she is unable to keep anything down. Reviewed how to eat and manage early pregnancy nausea conservatively to prevent need for admission. All questions answered.   Thomasene Mohair, MD 10/09/2017 1:03 PM

## 2017-10-09 NOTE — Telephone Encounter (Signed)
Per SDJ can send Phergan suppository. LMVM to notify. Pt TRC to advise if she desires this to be sent in.

## 2017-10-11 ENCOUNTER — Ambulatory Visit (INDEPENDENT_AMBULATORY_CARE_PROVIDER_SITE_OTHER): Payer: Medicaid Other | Admitting: Obstetrics and Gynecology

## 2017-10-11 ENCOUNTER — Encounter: Payer: Self-pay | Admitting: Obstetrics and Gynecology

## 2017-10-11 VITALS — BP 122/82 | HR 86 | Ht 64.0 in | Wt 213.0 lb

## 2017-10-11 DIAGNOSIS — O210 Mild hyperemesis gravidarum: Secondary | ICD-10-CM

## 2017-10-11 DIAGNOSIS — O3680X0 Pregnancy with inconclusive fetal viability, not applicable or unspecified: Secondary | ICD-10-CM

## 2017-10-11 DIAGNOSIS — Z3689 Encounter for other specified antenatal screening: Secondary | ICD-10-CM

## 2017-10-11 NOTE — Progress Notes (Signed)
    Routine Prenatal Care Visit  Subjective  Alexis Weiss is a 32 y.o. [phone] at 7333w4d being seen today for ongoing prenatal care.  She is currently monitored for the following issues for this high-risk pregnancy and has Morbid obesity (HCC); Hypothyroidism; HSV-2 (herpes simplex virus 2) infection; Hypertension in pregnancy, preeclampsia, delivered; Abnormal glucose level; Anemia affecting pregnancy; Breast pain; Cannabis abuse; Depo-Provera contraceptive status; Tension headache, chronic; Right-sided low back pain without sciatica; Anxiety; and Depression on their problem list.  ----------------------------------------------------------------------------------- Patient reports no complaints.    .  .   . Denies leaking of fluid.  ----------------------------------------------------------------------------------- The following portions of the patient's history were reviewed and updated as appropriate: allergies, current medications, past family history, past medical history, past social history, past surgical history and problem list. Problem list updated.   Objective  Blood pressure 122/82, pulse 86, height 5\' 4"  (1.626 m), weight 213 lb (96.6 kg), last menstrual period 08/14/2017. Pregravid weight No episode found Total Weight Gain Not found. Urinalysis:      Fetal Status:           General:  Alert, oriented and cooperative. Patient is in no acute distress.  Skin: Skin is warm and dry. No rash noted.   Cardiovascular: Normal heart rate noted  Respiratory: Normal respiratory effort, no problems with respiration noted  Abdomen: Soft, gravid, appropriate for gestational age.       Pelvic:  Cervical exam deferred        Extremities: Normal range of motion.     ental Status: Normal mood and affect. Normal behavior. Normal judgment and thought content.     Assessment   31 y.o. [id number] at 2733w4d by  Not found. presenting for routine prenatal visit  Plan    Gestational age  appropriate obstetric precautions including but not limited to vaginal bleeding, contractions, leaking of fluid and fetal movement were reviewed in detail with the patient.   - doing well continue bonjesta, given additional samples - NOB and follow up viability scan as most recent ultrasound in ER no evidence of FHT  Return in about 1 week (around 10/18/2017) for NOB and ultrasound.  Vena AustriaAndreas Khylee Algeo, MD, Evern CoreFACOG [facility], [facility] 10/15/2017, 11:01 AM

## 2017-10-21 ENCOUNTER — Ambulatory Visit (INDEPENDENT_AMBULATORY_CARE_PROVIDER_SITE_OTHER): Payer: Medicare Other | Admitting: Maternal Newborn

## 2017-10-21 ENCOUNTER — Encounter: Payer: Self-pay | Admitting: Maternal Newborn

## 2017-10-21 ENCOUNTER — Ambulatory Visit (INDEPENDENT_AMBULATORY_CARE_PROVIDER_SITE_OTHER): Payer: Medicare Other

## 2017-10-21 VITALS — BP 106/60 | Wt 217.0 lb

## 2017-10-21 DIAGNOSIS — O34219 Maternal care for unspecified type scar from previous cesarean delivery: Secondary | ICD-10-CM

## 2017-10-21 DIAGNOSIS — Z113 Encounter for screening for infections with a predominantly sexual mode of transmission: Secondary | ICD-10-CM

## 2017-10-21 DIAGNOSIS — O219 Vomiting of pregnancy, unspecified: Secondary | ICD-10-CM

## 2017-10-21 DIAGNOSIS — O09291 Supervision of pregnancy with other poor reproductive or obstetric history, first trimester: Secondary | ICD-10-CM

## 2017-10-21 DIAGNOSIS — O3680X0 Pregnancy with inconclusive fetal viability, not applicable or unspecified: Secondary | ICD-10-CM | POA: Diagnosis not present

## 2017-10-21 DIAGNOSIS — Z1329 Encounter for screening for other suspected endocrine disorder: Secondary | ICD-10-CM

## 2017-10-21 DIAGNOSIS — O9989 Other specified diseases and conditions complicating pregnancy, childbirth and the puerperium: Secondary | ICD-10-CM

## 2017-10-21 DIAGNOSIS — Z3A10 10 weeks gestation of pregnancy: Secondary | ICD-10-CM

## 2017-10-21 DIAGNOSIS — O0991 Supervision of high risk pregnancy, unspecified, first trimester: Secondary | ICD-10-CM

## 2017-10-21 DIAGNOSIS — Z3A09 9 weeks gestation of pregnancy: Secondary | ICD-10-CM

## 2017-10-21 DIAGNOSIS — Z6837 Body mass index (BMI) 37.0-37.9, adult: Secondary | ICD-10-CM

## 2017-10-21 DIAGNOSIS — Z124 Encounter for screening for malignant neoplasm of cervix: Secondary | ICD-10-CM

## 2017-10-21 DIAGNOSIS — R110 Nausea: Secondary | ICD-10-CM

## 2017-10-21 DIAGNOSIS — Z3689 Encounter for other specified antenatal screening: Secondary | ICD-10-CM

## 2017-10-21 MED ORDER — DOXYLAMINE-PYRIDOXINE 10-10 MG PO TBEC: 2.0000 | DELAYED_RELEASE_TABLET | Freq: Every day | ORAL | 5 refills | Status: DC

## 2017-10-21 NOTE — Progress Notes (Signed)
10/21/2017   Chief Complaint: Desires prenatal care.  Transfer of Care Patient: no  History of Present Illness: Ms. Battey is a 32 y.o. [id number] at [redacted]w[redacted]d based on Patient's last menstrual period on 08/14/2017 (approximate), with an Estimated Date of Delivery: 05/21/18, with the above CC.   Her periods were: irregular periods  She was using no method when she conceived.  She has Positive signs or symptoms of nausea/vomiting of pregnancy. Was in ED on 6/4 for nausea and vomiting and currently has daily symptoms. Started on [place] 6/5. She has Negative signs or symptoms of miscarriage or preterm labor She identifies Negative Zika risk factors for her and her partner On any different medications around the time she conceived/early pregnancy: Taking Protonix, has samples of [place] but insurance is not covering it. Not taking thyroid medication currently.  History of varicella: Yes   ROS: A 12-point review of systems was performed and negative, except as stated in the above HPI.  OBGYN History: As per HPI. OB History  Gravida Para Term Preterm AB Living  3 1   1 1 1   SAB TAB Ectopic Multiple Live Births  1       1    # Outcome Date GA Lbr Len/2nd Weight Sex Delivery Anes PTL Lv  3 Current           2 Preterm 04/19/15 [redacted]w[redacted]d  6 lb 8 oz (2.948 kg) M CS-LTranv  Y LIV  1 SAB             Obstetric Comments  C/o's of plus plus nausea and vomiting, on daily med    Any issues with any prior pregnancies: yes, G1 SAB, G2 delivered by Cesarean at 32 weeks for severe pre-eclampsia Any prior children are healthy, doing well, without any problems or issues: yes History of pap smears: Yes. Last pap smear 06/07/2014, NILM.  History of STIs: No   Past Medical History: Past Medical History:  Diagnosis Date  . Anemia    with pregnancy- on Iron med  . Anxiety   . Depression   . Gallstones   . Hypothyroid    on med- does not rmember name or dosage  . Left ovarian cyst   . Miscarriage   .  Morbid obesity with body mass index (BMI) of 45.0 to 49.9 in adult [facility])   . Oral herpes     Past Surgical History: Past Surgical History:  Procedure Laterality Date  . CESAREAN SECTION      Family History:  Family History  Problem Relation Age of Onset  . COPD Mother   . Asthma Mother   . Hypertension Father    She denies any female cancers, bleeding or blood clotting disorders.  She denies any history of intellectual disability, birth defects or genetic disorders in her or the FOB's history  Social History:  Social History   Socioeconomic History  . Marital status: Married    Spouse name: Not on file  . Number of children: 1  . Years of education: 36  . Highest education level: Not on file  Occupational History  . Occupation: UNEMPLOYED  Social Needs  . Financial resource strain: Not on file  . Food insecurity:    Worry: Not on file    Inability: Not on file  . Transportation needs:    Medical: Not on file    Non-medical: Not on file  Tobacco Use  . Smoking status: Never Smoker  . Smokeless tobacco: Never Used  Substance and Sexual Activity  . Alcohol use: No  . Drug use: No  . Sexual activity: Yes    Birth control/protection: None  Lifestyle  . Physical activity:    Days per week: Not on file    Minutes per session: Not on file  . Stress: Not on file  Relationships  . Social connections:    Talks on phone: Not on file    Gets together: Not on file    Attends religious service: Not on file    Active member of club or organization: Not on file    Attends meetings of clubs or organizations: Not on file    Relationship status: Not on file  . Intimate partner violence:    Fear of current or ex partner: Not on file    Emotionally abused: Not on file    Physically abused: Not on file    Forced sexual activity: Not on file  Other Topics Concern  . Not on file  Social History Narrative  . Not on file   Any cats in the household: yes, indoor and outdoor,  aware to avoid cat litter and feces and someone else changes litter box. Denies history of and current domestic violence.  Allergy: No Known Allergies  Current Outpatient Medications:  Current Outpatient Medications:  .  Doxylamine-Pyridoxine ER (BONJESTA) 20-20 MG TBCR, Take 1 tablet by mouth 2 (two) times daily., Disp: 60 tablet, Rfl: 2 .  Prenatal Vit-Fe Fumarate-FA (MULTIVITAMIN-PRENATAL) 27-0.8 MG TABS tablet, Take 1 tablet by mouth daily at 12 noon., Disp: , Rfl:  .  cephALEXin (KEFLEX) 500 MG capsule, TAKE 1 CAPSULE BY MOUTH THREE TIMES A DAY. FOR 7 DAYS, Disp: , Rfl: 0 .  pantoprazole (PROTONIX) 40 MG tablet, Take 1 tablet (40 mg total) by mouth daily. (Patient not taking: Reported on 10/11/2017), Disp: 30 tablet, Rfl: 2   Physical Exam:   BP 106/60   Wt 217 lb (98.4 kg)   LMP 08/14/2017 (Approximate)   BMI 37.25 kg/m  Body mass index is 37.25 kg/m. Constitutional: Well nourished, well developed female in no acute distress.  Neck:  Supple, normal appearance, and no thyromegaly  Cardiovascular: S1, S2 normal, no murmur, rub or gallop, regular rate and rhythm Respiratory:  Clear to auscultation bilaterally. Normal respiratory effort Abdomen: no masses, hernias; diffusely non tender to palpation, non distended Breasts: breasts appear normal, no suspicious masses, no skin or nipple changes or axillary nodes. Neuro/Psych:  Normal mood and affect.  Skin:  Warm and dry.  Lymphatic:  No inguinal lymphadenopathy.   Pelvic exam: is limited by body habitus External genitalia, Bartholin's glands, Urethra, Skene's glands: within normal limits Vagina: within normal limits and with no blood in the vault  Cervix: normal appearing cervix without discharge or lesions, closed/long/high Uterus:  enlarged: consistent with early pregnancy Adnexa:  no mass, fullness, tenderness  Assessment: Ms. Wardrop is a 32 y.o. [id number] at  [redacted]w[redacted]d based on Patient's last menstrual period on  08/14/2017  (approximate), with an Estimated Date of Delivery: 05/21/18, presenting for prenatal care.  Plan:  1) Avoid alcoholic beverages. 2) Patient encouraged not to smoke.  3) Discontinue the use of all non-medicinal drugs and chemicals. She admits using marijuana to help with nausea but states that she has stopped. 4) Take prenatal vitamins daily.  5) Seatbelt use advised 6) Nutrition, food safety (fish, cheese advisories, and high nitrite foods) and exercise discussed. 7) Hospital and practice style delivering at [facility] discussed  8) Patient is  asked about travel to areas at risk for the Zika virus, and counseled to avoid travel and exposure to mosquitoes or sexual partners who may have themselves been exposed to the virus. Testing is discussed, and will be ordered as appropriate.  9) Childbirth classes at [facility] advised 10) Genetic Screening, such as with 1st Trimester Screening, cell free fetal DNA, AFP testing, and Ultrasound, as well as with amniocentesis and CVS as appropriate, is discussed with patient. She plans to have genetic testing this pregnancy (MaterniTi21). 11) Will return fasting at next visit for GTT due to BMI of 37. 12) Check TSH with NOB labs. 13) Ultrasound today shows singleton IUP with size=dates and FHR 174 bpm. 14) Send Diclegis Rx to see if that will be covered. If not, will try sending Bonjesta to specialty pharmacy for cheaper cost.  Problem list reviewed and updated.  Return in about 1 week (around 10/28/2017) for ROB and MaterniTi 21.  Marcelyn BruinsJacelyn Schmid, CNM [facility], [facility] 10/21/2017  2:26 PM

## 2017-10-21 NOTE — Progress Notes (Signed)
C/O nausea really bad no vomiting.   Can she have generic bonjesta?

## 2017-10-22 LAB — RPR+RH+ABO+RUB AB+AB SCR+CB...
ANTIBODY SCREEN: NEGATIVE
HEMATOCRIT: 34.9 % (ref 34.0–46.6)
HIV Screen 4th Generation wRfx: NONREACTIVE
Hemoglobin: 11 g/dL — ABNORMAL LOW (ref 11.1–15.9)
Hepatitis B Surface Ag: NEGATIVE
MCH: 25.6 pg — AB (ref 26.6–33.0)
MCHC: 31.5 g/dL (ref 31.5–35.7)
MCV: 81 fL (ref 79–97)
PLATELETS: 233 10*3/uL (ref 150–450)
RBC: 4.29 x10E6/uL (ref 3.77–5.28)
RDW: 14.9 % (ref 12.3–15.4)
RPR: NONREACTIVE
Rh Factor: POSITIVE
Rubella Antibodies, IGG: 2.39 index (ref >0.99)
Varicella zoster IgG: 450 index (ref >165)
WBC: 10 10*3/uL (ref 3.4–10.8)

## 2017-10-22 LAB — TSH: TSH: 2.71 u[IU]/mL (ref 0.450–4.500)

## 2017-10-23 DIAGNOSIS — O0990 Supervision of high risk pregnancy, unspecified, unspecified trimester: Secondary | ICD-10-CM | POA: Insufficient documentation

## 2017-10-23 DIAGNOSIS — O09291 Supervision of pregnancy with other poor reproductive or obstetric history, first trimester: Secondary | ICD-10-CM | POA: Insufficient documentation

## 2017-10-24 LAB — PAP IG, CT-NG TV HPV-HR
Chlamydia, Nuc. Acid Amp: NEGATIVE
GONOCOCCUS, NUC. ACID AMP: NEGATIVE
HPV, HIGH-RISK: NEGATIVE
PAP Smear Comment: 0
Trich vag by NAA: NEGATIVE

## 2017-10-26 LAB — URINE CULTURE

## 2017-10-26 LAB — URINE DRUG PANEL 7
AMPHETAMINES, URINE: NEGATIVE ng/mL
Barbiturate Quant, Ur: NEGATIVE ng/mL
Benzodiazepine Quant, Ur: NEGATIVE ng/mL
COCAINE (METAB.): NEGATIVE ng/mL
Cannabinoid Quant, Ur: POSITIVE — AB
Opiate Quant, Ur: NEGATIVE ng/mL
PCP Quant, Ur: NEGATIVE ng/mL

## 2017-10-28 ENCOUNTER — Ambulatory Visit (INDEPENDENT_AMBULATORY_CARE_PROVIDER_SITE_OTHER): Payer: Medicare Other | Admitting: Obstetrics and Gynecology

## 2017-10-28 VITALS — BP 108/68 | Wt 217.0 lb

## 2017-10-28 DIAGNOSIS — O09291 Supervision of pregnancy with other poor reproductive or obstetric history, first trimester: Secondary | ICD-10-CM

## 2017-10-28 DIAGNOSIS — Z131 Encounter for screening for diabetes mellitus: Secondary | ICD-10-CM

## 2017-10-28 DIAGNOSIS — Z1379 Encounter for other screening for genetic and chromosomal anomalies: Secondary | ICD-10-CM

## 2017-10-28 DIAGNOSIS — O0991 Supervision of high risk pregnancy, unspecified, first trimester: Secondary | ICD-10-CM

## 2017-10-28 DIAGNOSIS — K591 Functional diarrhea: Secondary | ICD-10-CM

## 2017-10-28 DIAGNOSIS — O219 Vomiting of pregnancy, unspecified: Secondary | ICD-10-CM

## 2017-10-28 MED ORDER — PROMETHAZINE HCL 25 MG PO TABS: 25.0000 mg | ORAL_TABLET | Freq: Four times a day (QID) | ORAL | 2 refills | Status: DC | PRN

## 2017-10-28 MED ORDER — LOPERAMIDE HCL 2 MG PO TABS: 2.0000 mg | ORAL_TABLET | Freq: Four times a day (QID) | ORAL | 0 refills | Status: AC | PRN

## 2017-10-28 MED ORDER — ASPIRIN EC 81 MG PO TBEC: 81.0000 mg | DELAYED_RELEASE_TABLET | Freq: Every day | ORAL | 2 refills | Status: AC

## 2017-10-28 NOTE — Progress Notes (Addendum)
Routine Prenatal Care Visit  Subjective  Alexis Weiss is a 32 y.o. [id number] at [redacted]w[redacted]d being seen today for ongoing prenatal care.  She is currently monitored for the following issues for this high-risk pregnancy and has Morbid obesity (HCC); Hypothyroidism; HSV-2 (herpes simplex virus 2) infection; Abnormal glucose level; Anemia affecting pregnancy; Breast pain; Cannabis abuse; Tension headache, chronic; Right-sided low back pain without sciatica; Anxiety; Depression; History of pre-eclampsia in prior pregnancy, currently pregnant in first trimester; and Supervision of high risk pregnancy in first trimester on their problem list.  ----------------------------------------------------------------------------------- Patient reports nausea and diarrhea.   Contractions: Not present. Vag. Bleeding: None.   . Denies leaking of fluid.  ----------------------------------------------------------------------------------- The following portions of the patient's history were reviewed and updated as appropriate: allergies, current medications, past family history, past medical history, past social history, past surgical history and problem list. Problem list updated.   Objective  Blood pressure 108/68, weight 217 lb (98.4 kg), last menstrual period 08/14/2017. Pregravid weight 203 lb (92.1 kg) Total Weight Gain 14 lb (6.35 kg) Urinalysis: Urine Protein: Negative Urine Glucose: Negative  Fetal Status: Fetal Heart Rate (bpm): 169         General:  Alert, oriented and cooperative. Patient is in no acute distress.  Skin: Skin is warm and dry. No rash noted.   Cardiovascular: Normal heart rate noted  Respiratory: Normal respiratory effort, no problems with respiration noted  Abdomen: Soft, gravid, appropriate for gestational age. Pain/Pressure: Absent     Pelvic:  Cervical exam deferred        Extremities: Normal range of motion.     ental Status: Normal mood and affect. Normal behavior. Normal judgment  and thought content.     Assessment   31 y.o. [id number] at [redacted]w[redacted]d by  05/21/2018, by Last Menstrual Period presenting for routine prenatal visit  Plan   THIRD Problems (from 10/21/17 to present)    Problem Noted Resolved   Supervision of high risk pregnancy in first trimester 10/23/2017 by Oswaldo Conroy, CNM No   Overview Addendum 10/28/2017  9:31 AM by Natale Milch, MD    Clinic [facility] Prenatal Labs  Dating lmp=9 Blood type: A/Positive/-- (06/17 1500)   Genetic Screen 1 Screen:    AFP:     Quad:     NIPS: Antibody:Negative (06/17 1500)  Anatomic [place]  Rubella: 2.39 (06/17 1500) Varicella: Immune  GTT Early:               Third trimester:  RPR: Non Reactive (06/17 1500)   Rhogam  not applicable HBsAg: Negative (06/17 1500)   TDaP vaccine                        Flu Shot: HIV: Non Reactive (06/17 1500)   Baby Food                                GBS:   Contraception  Pap:NIL 2019  CBB     CS/VBAC    Support Person                  Gestational age appropriate obstetric precautions including but not limited to vaginal bleeding, contractions, leaking of fluid and fetal movement were reviewed in detail with the patient.    Patient does not want to do one hour this week because of nausea, refused. Will obtain hgba1c today. Will  have her return in 2 weeks for 1 GTT then. Hx preeclampsia- start daily 81 mg ASA at 12 weeks Materniti 21 today.  Loose stools, advised imodium Nausea, continue bonjesta, patient was given samples, will add promethazine.    Return in about 2 weeks (around 11/11/2017) for ROB and 1 GTT.  Adelene Idlerhristanna Schuman MD [facility], [facility] 10/28/17 9:52 AM

## 2017-10-28 NOTE — Progress Notes (Signed)
ROB Severe nausea no vomiting

## 2017-10-29 ENCOUNTER — Encounter: Payer: Self-pay | Admitting: Obstetrics and Gynecology

## 2017-10-29 LAB — HEMOGLOBIN A1C
ESTIMATED AVERAGE GLUCOSE: 111 mg/dL
HEMOGLOBIN A1C: 5.5 % (ref 4.8–5.6)

## 2017-10-29 NOTE — Progress Notes (Signed)
Normal, released to mychart

## 2017-11-04 ENCOUNTER — Encounter: Payer: Self-pay | Admitting: Obstetrics and Gynecology

## 2017-11-05 LAB — MATERNIT 21 PLUS CORE, BLOOD

## 2017-11-05 NOTE — Progress Notes (Signed)
Can you fins out if this was due to a low fetal fraction and needs to be repeated? Thank you, Dr. Jerene PitchSchuman

## 2017-11-06 NOTE — Progress Notes (Signed)
Yes please have her come for a redraw. Thank you, Dr. Jerene PitchSchuman

## 2017-11-11 ENCOUNTER — Other Ambulatory Visit: Payer: Medicaid Other

## 2017-11-11 ENCOUNTER — Ambulatory Visit (INDEPENDENT_AMBULATORY_CARE_PROVIDER_SITE_OTHER): Payer: Medicaid Other | Admitting: Obstetrics and Gynecology

## 2017-11-11 ENCOUNTER — Encounter: Payer: Self-pay | Admitting: Obstetrics and Gynecology

## 2017-11-11 VITALS — BP 108/68 | Wt 221.0 lb

## 2017-11-11 DIAGNOSIS — O219 Vomiting of pregnancy, unspecified: Secondary | ICD-10-CM

## 2017-11-11 DIAGNOSIS — O99012 Anemia complicating pregnancy, second trimester: Secondary | ICD-10-CM

## 2017-11-11 DIAGNOSIS — Z1379 Encounter for other screening for genetic and chromosomal anomalies: Secondary | ICD-10-CM

## 2017-11-11 DIAGNOSIS — O09291 Supervision of pregnancy with other poor reproductive or obstetric history, first trimester: Secondary | ICD-10-CM

## 2017-11-11 DIAGNOSIS — O0991 Supervision of high risk pregnancy, unspecified, first trimester: Secondary | ICD-10-CM

## 2017-11-11 MED ORDER — DOXYLAMINE SUCCINATE (SLEEP) 25 MG PO TABS: 25.0000 mg | ORAL_TABLET | Freq: Every day | ORAL | 6 refills | Status: DC

## 2017-11-11 MED ORDER — PYRIDOXINE HCL 25 MG PO TABS: 25.0000 mg | ORAL_TABLET | Freq: Four times a day (QID) | ORAL | 6 refills | Status: DC

## 2017-11-11 NOTE — Progress Notes (Signed)
Routine Prenatal Care Visit  Subjective  Alexis Weiss is a 32 y.o. [phone] at 4467w5d being seen today for ongoing prenatal care.  She is currently monitored for the following issues for this high-risk pregnancy and has Morbid obesity (HCC); Hypothyroidism; HSV-2 (herpes simplex virus 2) infection; Abnormal glucose level; Anemia affecting pregnancy; Breast pain; Cannabis abuse; Tension headache, chronic; Right-sided low back pain without sciatica; Anxiety; Depression; History of pre-eclampsia in prior pregnancy, currently pregnant in first trimester; and Supervision of high risk pregnancy in first trimester on their problem list.  ----------------------------------------------------------------------------------- Patient reports nausea and vomiting.  Improved with bonjesta, but she can not afford the medication. Contractions: Not present. Vag. Bleeding: None.   . Denies leaking of fluid.  ----------------------------------------------------------------------------------- The following portions of the patient's history were reviewed and updated as appropriate: allergies, current medications, past family history, past medical history, past social history, past surgical history and problem list. Problem list updated.   Objective  Blood pressure 108/68, weight 221 lb (100.2 kg), last menstrual period 08/14/2017. Pregravid weight 203 lb (92.1 kg) Total Weight Gain 18 lb (8.165 kg) Urinalysis: Urine Protein: Negative Urine Glucose: Negative  Fetal Status: Fetal Heart Rate (bpm): 154         General:  Alert, oriented and cooperative. Patient is in no acute distress.  Skin: Skin is warm and dry. No rash noted.   Cardiovascular: Normal heart rate noted  Respiratory: Normal respiratory effort, no problems with respiration noted  Abdomen: Soft, gravid, appropriate for gestational age. Pain/Pressure: Absent     Pelvic:  Cervical exam deferred        Extremities: Normal range of motion.     ental  Status: Normal mood and affect. Normal behavior. Normal judgment and thought content.     Assessment   32 y.o. [id number] at 8167w5d by  05/21/2018, by Last Menstrual Period presenting for routine prenatal visit  Plan   THIRD Problems (from 10/21/17 to present)    Problem Noted Resolved   Supervision of high risk pregnancy in first trimester 10/23/2017 by Oswaldo ConroySchmid, Jacelyn Y, CNM No   Overview Addendum 10/28/2017  9:31 AM by Natale MilchSchuman, Jaymie Misch R, MD    Clinic [facility] Prenatal Labs  Dating lmp=9 Blood type: A/Positive/-- (06/17 1500)   Genetic Screen 1 Screen:    AFP:     Quad:     NIPS: Antibody:Negative (06/17 1500)  Anatomic US  Rubella: 2.39 (06/17 1500) Varicella: Immune  GTT Early:               Third trimester:  RPR: Non Reactive (06/17 1500)   Rhogam  not applicable HBsAg: Negative (06/17 1500)   TDaP vaccine                        Flu Shot: HIV: Non Reactive (06/17 1500)   Baby Food                                GBS:   Contraception  Pap:NIL 2019  CBB     CS/VBAC    Support Person                  Gestational age appropriate obstetric precautions including but not limited to vaginal bleeding, contractions, leaking of fluid and fetal movement were reviewed in detail with the patient.    Materniti21 redraw today 1 hour GTT could not be done  today because of vomiting, she will return later this week. Prescriptions for B6 and unisom sent. She could not afford the bonjesta with her insurance.   Start ASA 81 mg now that she is past 12 weeks. Return in about 2 weeks (around 11/25/2017) for ROB (1GTT should be later this week).  Adelene Idler MD [facility], [facility] 11/11/17 10:59 AM

## 2017-11-14 ENCOUNTER — Other Ambulatory Visit: Payer: Medicaid Other

## 2017-11-15 ENCOUNTER — Encounter: Payer: Self-pay | Admitting: Maternal Newborn

## 2017-11-15 LAB — GLUCOSE, 1 HOUR GESTATIONAL: GESTATIONAL DIABETES SCREEN: 92 mg/dL (ref 65–139)

## 2017-11-15 NOTE — Telephone Encounter (Signed)
Pt left same message as sent thru portal. Checking on gender results. [phone]#[phone]

## 2017-11-17 LAB — MATERNIT 21 PLUS CORE, BLOOD
Chromosome 13: NEGATIVE
Chromosome 18: NEGATIVE
Chromosome 21: NEGATIVE
Y Chromosome: NOT DETECTED

## 2017-11-18 NOTE — Progress Notes (Signed)
Normal XX

## 2017-11-21 ENCOUNTER — Telehealth: Payer: Self-pay | Admitting: Obstetrics and Gynecology

## 2017-11-21 NOTE — Telephone Encounter (Signed)
Crystal will have to arrange transfer of care. Thank you, Dr. Jerene PitchSchuman

## 2017-11-21 NOTE — Telephone Encounter (Signed)
Patient is requesting an referral to [facility] care [facility]. Patient states she no longer wish to be patient here and that she needs a referral so they will see her. Please advise order to transfer care referral.

## 2017-11-25 ENCOUNTER — Encounter: Payer: Self-pay | Admitting: Maternal Newborn

## 2017-11-25 ENCOUNTER — Ambulatory Visit (INDEPENDENT_AMBULATORY_CARE_PROVIDER_SITE_OTHER): Payer: Medicaid Other | Admitting: Maternal Newborn

## 2017-11-25 VITALS — BP 110/70 | Wt 223.0 lb

## 2017-11-25 DIAGNOSIS — Z3689 Encounter for other specified antenatal screening: Secondary | ICD-10-CM

## 2017-11-25 DIAGNOSIS — R110 Nausea: Secondary | ICD-10-CM

## 2017-11-25 DIAGNOSIS — O0991 Supervision of high risk pregnancy, unspecified, first trimester: Secondary | ICD-10-CM

## 2017-11-25 DIAGNOSIS — O9989 Other specified diseases and conditions complicating pregnancy, childbirth and the puerperium: Secondary | ICD-10-CM

## 2017-11-25 DIAGNOSIS — Z3A14 14 weeks gestation of pregnancy: Secondary | ICD-10-CM

## 2017-11-25 NOTE — Progress Notes (Signed)
Routine Prenatal Care Visit  Subjective  Alexis Weiss is a 32 y.o. [phone] at [redacted]w[redacted]d being seen today for ongoing prenatal care.  She is currently monitored for the following issues for this high-risk pregnancy and has Morbid obesity (HCC); Hypothyroidism; HSV-2 (herpes simplex virus 2) infection; Abnormal glucose level; Anemia affecting pregnancy; Breast pain; Cannabis abuse; Tension headache, chronic; Right-sided low back pain without sciatica; Anxiety; Depression; History of pre-eclampsia in prior pregnancy, currently pregnant in first trimester; and Supervision of high risk pregnancy in first trimester on their problem list.  ----------------------------------------------------------------------------------- Patient reports fatigue, nausea and occasional light-headedness and shortness of breath.  Denies chest pain and difficulty breathing. Contractions: Not present. Vag. Bleeding: None.  Movement: Present. No leaking of fluid.  ----------------------------------------------------------------------------------- The following portions of the patient's history were reviewed and updated as appropriate: allergies, current medications, past family history, past medical history, past social history, past surgical history and problem list. Problem list updated.  Objective  Blood pressure 110/70, weight 223 lb (101.2 kg), last menstrual period 08/14/2017. Pregravid weight 203 lb (92.1 kg) Total Weight Gain 20 lb (9.072 kg) Urinalysis: Urine Protein: Negative Urine Glucose: Negative  Fetal Status: Fetal Heart Rate (bpm): 156   Movement: Present     General:  Alert, oriented and cooperative. Patient is in no acute distress.  Skin: Skin is warm and dry. No rash noted.   Cardiovascular: Normal heart rate noted  Respiratory: Normal respiratory effort, no problems with respiration noted  Abdomen: Soft, gravid, appropriate for gestational age. Pain/Pressure: Absent     Pelvic:  Cervical exam  deferred        Extremities: Normal range of motion.  Edema: None  Mental Status: Normal mood and affect. Normal behavior. Normal judgment and thought content.    Assessment   32 y.o. [id number] at [redacted]w[redacted]d, EDD 05/21/2018 by Last Menstrual Period presenting for routine prenatal visit.  Plan   THIRD Problems (from 10/21/17 to present)    Problem Noted Resolved   Supervision of high risk pregnancy in first trimester 10/23/2017 by Oswaldo Conroy, CNM No   Overview Addendum 10/28/2017  9:31 AM by Natale Milch, MD    Clinic [facility] Prenatal Labs  Dating lmp=9 Blood type: A/Positive/-- (06/17 1500)   Genetic Screen 1 Screen:    AFP:     Quad:     NIPS: Antibody:Negative (06/17 1500)  Anatomic [place]  Rubella: 2.39 (06/17 1500) Varicella: Immune  GTT Early:               Third trimester:  RPR: Non Reactive (06/17 1500)   Rhogam  not applicable HBsAg: Negative (06/17 1500)   TDaP vaccine                        Flu Shot: HIV: Non Reactive (06/17 1500)   Baby Food                                GBS:   Contraception  Pap:NIL 2019  CBB     CS/VBAC    Support Person               Still having nausea, gave Bonjesta samples and encouraged to try Unisom and B6 as ordered after they run out.  Samples given of small prenatals with iron (Prenate Pixie); may help with symptoms if she can tolerate these.  Gestational age appropriate obstetric  precautions were reviewed.  Please refer to After Visit Summary for other counseling recommendations.   Return in about 1 month (around 12/23/2017) for ROB with anatomy scan.  Marcelyn BruinsJacelyn Atley [place], CNM 11/25/2017  4:48 PM

## 2017-11-25 NOTE — Patient Instructions (Signed)

## 2017-11-25 NOTE — Progress Notes (Signed)
C/O SOB yesterday while cleaning floor, no chest pain.rj

## 2017-11-26 NOTE — Telephone Encounter (Signed)
Spoke w/JS regarding this patient and her request to be transferred. JS saw this patient 11/25/17. Pt stated at that apt that she planned to continue her care here.

## 2017-11-26 NOTE — Telephone Encounter (Signed)
Pt returned call. Discussed w/pt her request to be transferred [facility]. She first said she did still want to transfer. I mentioned that JS had discussed w/her yesterday & she had stated she planned to continue care with us. Pt states the reason she wanted to be transferred is because she delivered her son there 2 years ago. Explained to pt that she can still continue care here & [facility]. Advised pt I would have JS contact her to explain/discuss her options for coordinating care w/DP and make a decision on her preference prior to completely transferring as once that is done, we can no longer see her.

## 2017-11-26 NOTE — Telephone Encounter (Signed)
LMVM TRC. Calling to verify w/patient that she is happy with her care here and no longer wants to be [facility].

## 2017-12-23 ENCOUNTER — Encounter: Payer: Self-pay | Admitting: Obstetrics and Gynecology

## 2017-12-23 ENCOUNTER — Ambulatory Visit (INDEPENDENT_AMBULATORY_CARE_PROVIDER_SITE_OTHER): Payer: Medicaid Other | Admitting: Obstetrics and Gynecology

## 2017-12-23 ENCOUNTER — Ambulatory Visit (INDEPENDENT_AMBULATORY_CARE_PROVIDER_SITE_OTHER): Payer: Medicare Other

## 2017-12-23 VITALS — BP 108/68 | Wt 233.0 lb

## 2017-12-23 DIAGNOSIS — F1210 Cannabis abuse, uncomplicated: Secondary | ICD-10-CM

## 2017-12-23 DIAGNOSIS — Z3689 Encounter for other specified antenatal screening: Secondary | ICD-10-CM

## 2017-12-23 DIAGNOSIS — Z363 Encounter for antenatal screening for malformations: Secondary | ICD-10-CM | POA: Diagnosis not present

## 2017-12-23 DIAGNOSIS — O99012 Anemia complicating pregnancy, second trimester: Secondary | ICD-10-CM

## 2017-12-23 DIAGNOSIS — F329 Major depressive disorder, single episode, unspecified: Secondary | ICD-10-CM

## 2017-12-23 DIAGNOSIS — O34219 Maternal care for unspecified type scar from previous cesarean delivery: Secondary | ICD-10-CM

## 2017-12-23 DIAGNOSIS — O99210 Obesity complicating pregnancy, unspecified trimester: Secondary | ICD-10-CM | POA: Insufficient documentation

## 2017-12-23 DIAGNOSIS — Z3A18 18 weeks gestation of pregnancy: Secondary | ICD-10-CM

## 2017-12-23 DIAGNOSIS — O99342 Other mental disorders complicating pregnancy, second trimester: Secondary | ICD-10-CM

## 2017-12-23 DIAGNOSIS — F32A Depression, unspecified: Secondary | ICD-10-CM

## 2017-12-23 DIAGNOSIS — E034 Atrophy of thyroid (acquired): Secondary | ICD-10-CM

## 2017-12-23 DIAGNOSIS — B009 Herpesviral infection, unspecified: Secondary | ICD-10-CM

## 2017-12-23 DIAGNOSIS — F418 Other specified anxiety disorders: Secondary | ICD-10-CM

## 2017-12-23 DIAGNOSIS — O0990 Supervision of high risk pregnancy, unspecified, unspecified trimester: Secondary | ICD-10-CM

## 2017-12-23 DIAGNOSIS — O09291 Supervision of pregnancy with other poor reproductive or obstetric history, first trimester: Secondary | ICD-10-CM

## 2017-12-23 DIAGNOSIS — O99322 Drug use complicating pregnancy, second trimester: Secondary | ICD-10-CM

## 2017-12-23 DIAGNOSIS — F419 Anxiety disorder, unspecified: Secondary | ICD-10-CM

## 2017-12-23 DIAGNOSIS — Z98891 History of uterine scar from previous surgery: Secondary | ICD-10-CM | POA: Insufficient documentation

## 2017-12-23 DIAGNOSIS — O0991 Supervision of high risk pregnancy, unspecified, first trimester: Secondary | ICD-10-CM

## 2017-12-23 DIAGNOSIS — O09292 Supervision of pregnancy with other poor reproductive or obstetric history, second trimester: Secondary | ICD-10-CM

## 2017-12-23 DIAGNOSIS — Z6834 Body mass index (BMI) 34.0-34.9, adult: Secondary | ICD-10-CM

## 2017-12-23 DIAGNOSIS — O99212 Obesity complicating pregnancy, second trimester: Secondary | ICD-10-CM

## 2017-12-23 MED ORDER — SERTRALINE HCL 50 MG PO TABS: 50.0000 mg | ORAL_TABLET | Freq: Every day | ORAL | 0 refills | Status: DC

## 2017-12-23 NOTE — Progress Notes (Signed)
Routine Prenatal Care Visit  Subjective  Alexis SlickerCathy M Guthmiller is a 32 y.o. [phone] at 4970w5d being seen today for ongoing prenatal care.  She is currently monitored for the following issues for this high-risk pregnancy and has Morbid obesity (HCC); Hypothyroidism; HSV-2 (herpes simplex virus 2) infection; Abnormal glucose level; Breast pain; Cannabis abuse; Tension headache, chronic; Right-sided low back pain without sciatica; Anxiety; Depression; History of pre-eclampsia in prior pregnancy, currently pregnant in first trimester; Supervision of high risk pregnancy, antepartum; Obesity affecting pregnancy; BMI 34.0-34.9,adult; and History of cesarean delivery on their problem list.  ----------------------------------------------------------------------------------- Patient reports some symptoms relating to depression and anxiety. Denies SI/HI.   Contractions: Not present. Vag. Bleeding: None.  Movement: Present. Denies leaking of fluid.  Anatomy u/s today incomplete.  No abnormalities noted.   Edinburgh Postnatal Depression Scale - 12/23/17 1750      Edinburgh Postnatal Depression Scale:  In the Past 7 Days   I have been able to laugh and see the funny side of things.  1    I have looked forward with enjoyment to things.  2    I have blamed myself unnecessarily when things went wrong.  2    I have been anxious or worried for no good reason.  3    I have felt scared or panicky for no good reason.  0    Things have been getting on top of me.  3    I have been so unhappy that I have had difficulty sleeping.  2    I have felt sad or miserable.  2    I have been so unhappy that I have been crying.  0    The thought of harming myself has occurred to me.  0    Edinburgh Postnatal Depression Scale Total  15       ----------------------------------------------------------------------------------- The following portions of the patient's history were reviewed and updated as appropriate: allergies, current  medications, past family history, past medical history, past social history, past surgical history and problem list. Problem list updated.   Objective  Blood pressure 108/68, weight 233 lb (105.7 kg), last menstrual period 08/14/2017, unknown if currently breastfeeding. Pregravid weight 203 lb (92.1 kg) Total Weight Gain 30 lb (13.6 kg) Urinalysis:      Fetal Status: Fetal Heart Rate (bpm): 155   Movement: Present     General:  Alert, oriented and cooperative. Patient is in no acute distress.  Skin: Skin is warm and dry. No rash noted.   Cardiovascular: Normal heart rate noted  Respiratory: Normal respiratory effort, no problems with respiration noted  Abdomen: Soft, gravid, appropriate for gestational age. Pain/Pressure: Absent     Pelvic:  Cervical exam deferred        Extremities: Normal range of motion.  Edema: None  Mental Status: Normal mood and affect. Normal behavior. Normal judgment and thought content.   [place] Ob Comp + 14 Wk  Result Date: 12/23/2017 ULTRASOUND REPORT Location: [facility] Date of Service: 12/23/2017 Patient Name: Alexis SlickerCathy M Mulvihill DOB: 1985/07/22 MRN: [id number] Indications:Anatomy Ultrasound Findings: Mason JimSingleton intrauterine pregnancy is visualized with FHR at 155 BPM. Biometrics give an (U/S) Gestational age of 573w6d and an (U/S) EDD of 05/20/2018; this correlates with the clinically established Estimated Date of Delivery: 05/21/18 Fetal presentation is transverse head left EFW: 264 gram (9oz). Placenta: Anterior circumvallate, Grade 0. AFI: subjectively normal. Anatomic survey is incomplete for cardiac views and face due to fetal position and normal; Gender - female.  Right Ovary is normal in appearance. Left Ovary is normal appearance. Survey of the adnexa demonstrates no adnexal masses. There is no free peritoneal fluid in the cul de sac. Impression: 1. [redacted]w[redacted]d Viable Singleton Intrauterine pregnancy by U/S. 2. (U/S) EDD is consistent with Clinically established  Estimated Date of Delivery: 05/21/18 . 3. Normal Anatomy Scan, some views incomplete or suboptimal. Recommendations: 1.Clinical correlation with the patient's History and Physical Exam. 2. Follow up anatomy in 2 - 4 weeks Mital bahen P Patel, RDMS There is a singleton gestation with subjectively normal amniotic fluid volume. The fetal biometry correlates with established dating. Detailed evaluation of the fetal anatomy was performed.The fetal anatomical survey appears within normal limits within the resolution of ultrasound as described above.  Not all structures were able to be visualized on today's study.  It is recommended that a follow-up ultrasound be performed to complete visualization of the unobserved structures today.  It must be noted that a normal ultrasound is unable to rule out fetal aneuploidy nor is it able to detect all possible malformations.   The ultrasound images and findings were reviewed by me and I agree with the above report. Thomasene Mohair, MD, [facility], [facility] 12/23/2017 2:28 PM    Assessment   32 y.o. [id number] at [redacted]w[redacted]d by  05/21/2018, by Last Menstrual Period presenting for routine prenatal visit  Plan   THIRD Problems (from 10/21/17 to present)    Problem Noted Resolved   Obesity affecting pregnancy 12/23/2017 by Conard Novak, MD No   BMI 34.0-34.9,adult 12/23/2017 by Conard Novak, MD No   History of cesarean delivery 12/23/2017 by Conard Novak, MD No   History of pre-eclampsia in prior pregnancy, currently pregnant in first trimester 10/23/2017 by Oswaldo Conroy, CNM No   Supervision of high risk pregnancy, antepartum 10/23/2017 by Oswaldo Conroy, CNM No   Overview Addendum 10/28/2017  9:31 AM by Natale Milch, MD    Clinic [facility] Prenatal Labs  Dating lmp=9 Blood type: A/Positive/-- (06/17 1500)   Genetic Screen 1 Screen:    AFP:     Quad:     NIPS: Antibody:Negative (06/17 1500)  Anatomic [place]  Rubella: 2.39  (06/17 1500) Varicella: Immune  GTT Early:               Third trimester:  RPR: Non Reactive (06/17 1500)   Rhogam  not applicable HBsAg: Negative (06/17 1500)   TDaP vaccine                        Flu Shot: HIV: Non Reactive (06/17 1500)   Baby Food                                GBS:   Contraception  Pap:NIL 2019  CBB     CS/VBAC    Support Person              Anxiety 01/31/2016 by Adela Ports, CMA No   Overview Signed 04/08/2017 11:04 AM by Adela Ports, CMA         Cannabis abuse 04/14/2015 by Elvina Mattes D, CMA No   HSV-2 (herpes simplex virus 2) infection 04/03/2015 by Farrel Conners, CNM No   Anemia affecting pregnancy 04/14/2015 by Lavinia Sharps, CMA 12/23/2017 by Conard Novak, MD       Preterm labor symptoms and general obstetric  precautions including but not limited to vaginal bleeding, contractions, leaking of fluid and fetal movement were reviewed in detail with the patient. Please refer to After Visit Summary for other counseling recommendations.   - patient declines flu vaccine - patient counseled regarding anxiety/depression.  [facility] information offered.  Discussed counseling and medication. Patient states she will contact either [facility] or [facility]. Will start Zoloft 50 mg today. Patient able to contract for safety. - patient states that [facility] she [facility] deliver [facility], as she delivered there for her first son. Discussed that she is a goood candidate [facility] at this point.  She has already contacted [facility] to arrange transfer of care. Will continue to perform prenatal care until she is transferred.   Return in about 2 weeks (around 01/06/2018) for schedule completion anatomy u/s and routine prenatal after.  Thomasene MohairStephen Charrisse Masley, MD, [facility], [facility] 12/23/2017 5:49 PM

## 2018-01-07 ENCOUNTER — Ambulatory Visit (INDEPENDENT_AMBULATORY_CARE_PROVIDER_SITE_OTHER): Payer: Medicare Other

## 2018-01-07 ENCOUNTER — Encounter: Payer: Self-pay | Admitting: Maternal Newborn

## 2018-01-07 ENCOUNTER — Ambulatory Visit (INDEPENDENT_AMBULATORY_CARE_PROVIDER_SITE_OTHER): Payer: Medicare Other | Admitting: Maternal Newborn

## 2018-01-07 VITALS — BP 100/70 | Wt 234.0 lb

## 2018-01-07 DIAGNOSIS — Z3689 Encounter for other specified antenatal screening: Secondary | ICD-10-CM

## 2018-01-07 DIAGNOSIS — Z362 Encounter for other antenatal screening follow-up: Secondary | ICD-10-CM

## 2018-01-07 DIAGNOSIS — K219 Gastro-esophageal reflux disease without esophagitis: Secondary | ICD-10-CM | POA: Diagnosis not present

## 2018-01-07 DIAGNOSIS — Z3A20 20 weeks gestation of pregnancy: Secondary | ICD-10-CM

## 2018-01-07 DIAGNOSIS — O219 Vomiting of pregnancy, unspecified: Secondary | ICD-10-CM

## 2018-01-07 DIAGNOSIS — O99612 Diseases of the digestive system complicating pregnancy, second trimester: Secondary | ICD-10-CM | POA: Diagnosis not present

## 2018-01-07 DIAGNOSIS — O0990 Supervision of high risk pregnancy, unspecified, unspecified trimester: Secondary | ICD-10-CM

## 2018-01-07 LAB — POCT URINALYSIS DIPSTICK OB
Glucose, UA: NEGATIVE
PROTEIN: NEGATIVE

## 2018-01-07 MED ORDER — DOXYLAMINE-PYRIDOXINE 10-10 MG PO TBEC: 2.0000 | DELAYED_RELEASE_TABLET | Freq: Every day | ORAL | 3 refills | Status: AC

## 2018-01-07 MED ORDER — RANITIDINE HCL 150 MG PO TABS: 150.0000 mg | ORAL_TABLET | Freq: Two times a day (BID) | ORAL | 11 refills | Status: AC

## 2018-01-07 MED ORDER — ONDANSETRON 4 MG PO TBDP: 4.0000 mg | ORAL_TABLET | Freq: Four times a day (QID) | ORAL | 0 refills | Status: DC | PRN

## 2018-01-07 NOTE — Telephone Encounter (Signed)
As discussed, patient would like to deliver at [facility] but [facility] has noted on 7/18 that she cannot transfer with self-referral with Medicaid. Therefore, she would need to find another practice that delivers at [facility] to accept her as an OB patient.

## 2018-01-07 NOTE — Progress Notes (Signed)
Routine Prenatal Care Visit  Subjective  Alexis Weiss is a 32 y.o. [phone] at [redacted]w[redacted]d being seen today for ongoing prenatal care.  She is currently monitored for the following issues for this high-risk pregnancy and has Morbid obesity (HCC); Hypothyroidism; HSV-2 (herpes simplex virus 2) infection; Abnormal glucose level; Breast pain; Cannabis abuse; Tension headache, chronic; Right-sided low back pain without sciatica; Anxiety; Depression; History of pre-eclampsia in prior pregnancy, currently pregnant in first trimester; Supervision of high risk pregnancy, antepartum; Obesity affecting pregnancy; BMI 34.0-34.9,adult; and History of cesarean delivery on their problem list.  ----------------------------------------------------------------------------------- Patient reports heartburn and nausea and vomiting that wake her up at night. Able to keep foods down when taking medications, but nausea and vomiting are persistent and happen daily.   Contractions: Not present. Vag. Bleeding: None.  Movement: Present. No leaking of fluid.  ----------------------------------------------------------------------------------- The following portions of the patient's history were reviewed and updated as appropriate: allergies, current medications, past family history, past medical history, past social history, past surgical history and problem list. Problem list updated.   Objective  Blood pressure 100/70, weight 234 lb (106.1 kg), last menstrual period 08/14/2017. Pregravid weight 203 lb (92.1 kg) Total Weight Gain 31 lb (14.1 kg) Body mass index is 40.17 kg/m.   Urinalysis: Protein Negative, Glucose Negative  Fetal Status: Fetal Heart Rate (bpm): 153   Movement: Present     General:  Alert, oriented and cooperative. Patient is in no acute distress.  Skin: Skin is warm and dry. No rash noted.   Cardiovascular: Normal heart rate noted  Respiratory: Normal respiratory effort, no problems with respiration  noted  Abdomen: Soft, gravid, appropriate for gestational age. Pain/Pressure: Present     Pelvic:  Cervical exam deferred        Extremities: Normal range of motion.  Edema: None  Mental Status: Normal mood and affect. Normal behavior. Normal judgment and thought content.     Assessment   32 y.o. [id number] at [redacted]w[redacted]d, EDD 05/21/2018 by Last Menstrual Period presenting for a routine prenatal visit.  Plan   THIRD Problems (from 10/21/17 to present)    Problem Noted Resolved   Obesity affecting pregnancy 12/23/2017 by Conard Novak, MD No   BMI 34.0-34.9,adult 12/23/2017 by Conard Novak, MD No   History of cesarean delivery 12/23/2017 by Conard Novak, MD No   History of pre-eclampsia in prior pregnancy, currently pregnant in first trimester 10/23/2017 by Oswaldo Conroy, CNM No   Supervision of high risk pregnancy, antepartum 10/23/2017 by Oswaldo Conroy, CNM No   Overview Addendum 10/28/2017  9:31 AM by Natale Milch, MD    Clinic [facility] Prenatal Labs  Dating lmp=9 Blood type: A/Positive/-- (06/17 1500)   Genetic Screen 1 Screen:    AFP:     Quad:     NIPS: Antibody:Negative (06/17 1500)  Anatomic [place]  Rubella: 2.39 (06/17 1500) Varicella: Immune  GTT Early:               Third trimester:  RPR: Non Reactive (06/17 1500)   Rhogam  not applicable HBsAg: Negative (06/17 1500)   TDaP vaccine                        Flu Shot: HIV: Non Reactive (06/17 1500)   Baby Food  GBS:   Contraception  Pap:NIL 2019  CBB     CS/VBAC    Support Person              Anxiety 01/31/2016 by Adela Ports, CMA No   Overview Signed 04/08/2017 11:04 AM by Adela Ports, CMA    Last Assessment & Plan:  Ongoing coupled with depression GAD-7 score is 9 today,  PSQ-9 score is 3 today Has stopped taking the prozac. Discussed numerous medication options including SSRIs, SNRIs, Wellbutrin, Buspirone- declined Offered referral to perinatal psych-  declined  Recent loss of her grandmother and upcoming wedding have increased her anxiety- she said we could talk more about it next visit      Cannabis abuse 04/14/2015 by Elvina Mattes D, CMA No   HSV-2 (herpes simplex virus 2) infection 04/03/2015 by Farrel Conners, CNM No   Anemia affecting pregnancy 04/14/2015 by Lavinia Sharps, CMA 12/23/2017 by Conard Novak, MD    Anatomy scan still incomplete for LVOT and profile. Repeat next time.  Will try Zofran Rx to see if it works better. May continue with Diclegis. She is taking Zantac for acid reflux. Will send Rx  Patient confirmed that she would like to deliver at [facility]. Will talk to office staff here about how to get her connected to a prenatal provider - per 7/18 note in chart from [facility] she cannot self-refer. Will continue with [facility] while this is being resolved.  Gestational age appropriate obstetric precautions were reviewed.  Return in about 2 weeks (around 01/21/2018) for ROB and follow up anatomy scan.  Marcelyn Bruins, CNM 01/07/2018  4:47 PM

## 2018-01-10 NOTE — Telephone Encounter (Signed)
Pt is scheduled w/[facility] 01/20/18 for MFM & U/S 10/1. Care Transferred. Pt aware.

## 2018-01-11 ENCOUNTER — Other Ambulatory Visit: Payer: Self-pay | Admitting: Obstetrics and Gynecology

## 2018-01-11 DIAGNOSIS — O219 Vomiting of pregnancy, unspecified: Secondary | ICD-10-CM

## 2018-01-16 ENCOUNTER — Other Ambulatory Visit: Payer: Self-pay | Admitting: Obstetrics and Gynecology

## 2018-01-16 DIAGNOSIS — F419 Anxiety disorder, unspecified: Secondary | ICD-10-CM

## 2018-01-16 DIAGNOSIS — O0990 Supervision of high risk pregnancy, unspecified, unspecified trimester: Secondary | ICD-10-CM

## 2018-01-16 DIAGNOSIS — F32A Depression, unspecified: Secondary | ICD-10-CM

## 2018-01-16 DIAGNOSIS — F329 Major depressive disorder, single episode, unspecified: Secondary | ICD-10-CM

## 2018-01-16 NOTE — Telephone Encounter (Signed)
Pt last saw Jaci on 9/3. Please advise

## 2018-01-22 ENCOUNTER — Other Ambulatory Visit: Payer: Medicaid Other

## 2018-01-22 ENCOUNTER — Encounter: Payer: Medicaid Other | Admitting: Maternal Newborn

## 2018-08-13 ENCOUNTER — Encounter (HOSPITAL_COMMUNITY): Payer: Self-pay

## 2018-10-04 ENCOUNTER — Emergency Department
Admission: EM | Admit: 2018-10-04 | Discharge: 2018-10-04 | Disposition: A | Discharge status: Home / Self Care | Payer: Medicaid Other | Attending: Emergency Medicine | Admitting: Emergency Medicine

## 2018-10-04 ENCOUNTER — Other Ambulatory Visit: Payer: Self-pay

## 2018-10-04 DIAGNOSIS — Z5321 Procedure and treatment not carried out due to patient leaving prior to being seen by health care provider: Secondary | ICD-10-CM | POA: Insufficient documentation

## 2018-10-04 DIAGNOSIS — R109 Unspecified abdominal pain: Secondary | ICD-10-CM | POA: Insufficient documentation

## 2018-10-04 LAB — COMPREHENSIVE METABOLIC PANEL
ALT: 26 U/L (ref 0–44)
AST: 32 U/L (ref 15–41)
Albumin: 4.3 g/dL (ref 3.5–5.0)
Alkaline Phosphatase: 102 U/L (ref 38–126)
Anion gap: 11 (ref 5–15)
BUN: 11 mg/dL (ref 6–20)
CO2: 22 mmol/L (ref 22–32)
Calcium: 9.1 mg/dL (ref 8.9–10.3)
Chloride: 105 mmol/L (ref 98–111)
Creatinine, Ser: 0.81 mg/dL (ref 0.44–1.00)
GFR calc Af Amer: >60 mL/min (ref >60)
GFR calc non Af Amer: >60 mL/min (ref >60)
Glucose, Bld: 128 mg/dL — ABNORMAL HIGH (ref 70–99)
Potassium: 3.6 mmol/L (ref 3.5–5.1)
Sodium: 138 mmol/L (ref 135–145)
Total Bilirubin: 0.3 mg/dL (ref 0.3–1.2)
Total Protein: 8.2 g/dL — ABNORMAL HIGH (ref 6.5–8.1)

## 2018-10-04 LAB — CBC
HCT: 35.2 % — ABNORMAL LOW (ref 36.0–46.0)
Hemoglobin: 10.6 g/dL — ABNORMAL LOW (ref 12.0–15.0)
MCH: 20.7 pg — ABNORMAL LOW (ref 26.0–34.0)
MCHC: 30.1 g/dL (ref 30.0–36.0)
MCV: 68.8 fL — ABNORMAL LOW (ref 80.0–100.0)
Platelets: 277 10*3/uL (ref 150–400)
RBC: 5.12 MIL/uL — ABNORMAL HIGH (ref 3.87–5.11)
RDW: 17.9 % — ABNORMAL HIGH (ref 11.5–15.5)
WBC: 10.7 10*3/uL — ABNORMAL HIGH (ref 4.0–10.5)
nRBC: 0 % (ref 0.0–0.2)

## 2018-10-04 LAB — URINALYSIS, COMPLETE (UACMP) WITH MICROSCOPIC
Bacteria, UA: NONE SEEN
Bilirubin Urine: NEGATIVE
Glucose, UA: NEGATIVE mg/dL
Hgb urine dipstick: NEGATIVE
Ketones, ur: NEGATIVE mg/dL
Nitrite: NEGATIVE
Protein, ur: NEGATIVE mg/dL
Specific Gravity, Urine: 1.02 (ref 1.005–1.030)
pH: 5 (ref 5.0–8.0)

## 2018-10-04 LAB — LIPASE, BLOOD: Lipase: 32 U/L (ref 11–51)

## 2018-10-04 LAB — PREGNANCY, URINE: Preg Test, Ur: NEGATIVE

## 2018-10-04 MED ORDER — SODIUM CHLORIDE 0.9% FLUSH: 3.0000 mL | Freq: Once | INTRAVENOUS | Status: DC

## 2018-10-04 NOTE — ED Triage Notes (Signed)
Pt c/o right upper quadrant pain starting at 5, vomited once. No nausea and vomiting after this, but pain remains. Also c/o diaphoresis when she was nauseated.

## 2018-10-04 NOTE — ED Notes (Signed)
Pt updated on wait time and is understanding.  

## 2018-10-06 ENCOUNTER — Telehealth: Payer: Self-pay | Admitting: Emergency Medicine

## 2018-10-06 NOTE — Telephone Encounter (Signed)
Called patient due to lwot to inquire about condition and follow up plans. Left message.   

## 2018-10-06 NOTE — Telephone Encounter (Signed)
Patient called me back.  Says she still has pain.  I told her she can always return here, but that she needs md exam.  I told her she may be able to be seen at a place like [facility], but some urgent cares will not see abd pain. I told her that wherever she goes she needs to make them aware of labs done here.  She agrees.

## 2018-12-10 ENCOUNTER — Other Ambulatory Visit: Payer: Self-pay

## 2018-12-10 ENCOUNTER — Emergency Department
Admission: EM | Admit: 2018-12-10 | Discharge: 2018-12-10 | Disposition: A | Discharge status: Home / Self Care | Payer: Medicaid Other | Attending: Student | Admitting: Student

## 2018-12-10 ENCOUNTER — Encounter: Payer: Self-pay | Admitting: Emergency Medicine

## 2018-12-10 DIAGNOSIS — M5441 Lumbago with sciatica, right side: Secondary | ICD-10-CM

## 2018-12-10 DIAGNOSIS — Z3A01 Less than 8 weeks gestation of pregnancy: Secondary | ICD-10-CM

## 2018-12-10 DIAGNOSIS — Z3201 Encounter for pregnancy test, result positive: Secondary | ICD-10-CM | POA: Diagnosis not present

## 2018-12-10 DIAGNOSIS — Z7982 Long term (current) use of aspirin: Secondary | ICD-10-CM | POA: Diagnosis not present

## 2018-12-10 DIAGNOSIS — Z79899 Other long term (current) drug therapy: Secondary | ICD-10-CM | POA: Insufficient documentation

## 2018-12-10 DIAGNOSIS — E039 Hypothyroidism, unspecified: Secondary | ICD-10-CM | POA: Diagnosis not present

## 2018-12-10 DIAGNOSIS — M545 Low back pain: Secondary | ICD-10-CM | POA: Diagnosis present

## 2018-12-10 LAB — URINALYSIS, COMPLETE (UACMP) WITH MICROSCOPIC
Bacteria, UA: NONE SEEN
Bilirubin Urine: NEGATIVE
Glucose, UA: NEGATIVE mg/dL
Hgb urine dipstick: NEGATIVE
Ketones, ur: NEGATIVE mg/dL
Leukocytes,Ua: NEGATIVE
Nitrite: NEGATIVE
Protein, ur: NEGATIVE mg/dL
Specific Gravity, Urine: 1.018 (ref 1.005–1.030)
pH: 5 (ref 5.0–8.0)

## 2018-12-10 LAB — POCT PREGNANCY, URINE: Preg Test, Ur: POSITIVE — AB

## 2018-12-10 NOTE — ED Notes (Signed)
Pt reports lower right back pain for about 2 months. Pt has tried ice and heat with no help. Pt not able to sleep well due to this. Pt reports she took an at home pregnancy test yesterday that was positive.

## 2018-12-10 NOTE — Discharge Instructions (Addendum)
Keep your appointment with left side as already scheduled.  You may continue using Tylenol and ice packs as needed.  You may also use a TENS unit to this area.  These are available at any pharmacy, [facility], [facility] in the same area as the sports creams.

## 2018-12-10 NOTE — ED Triage Notes (Signed)
Pt here with c/o right lower back pain for 2 months now, NAD.

## 2018-12-10 NOTE — ED Provider Notes (Signed)
[facility] Emergency Department Provider Note  ____________________________________________   First MD Initiated Contact with Patient 12/10/18 1115     (approximate)  I have reviewed the triage vital signs and the nursing notes.   HISTORY  Chief Complaint Back Pain   HPI Alexis Weiss is a 33 y.o. female presents to the ED with complaint of right lower back pain for 2 months.  Patient denies any injury.  She states she has been using over-the-counter creams and Tylenol without any relief.  She denies any urinary symptoms.  She also reports that she took a pregnancy test at home yesterday which was positive.  She continues to ambulate without any assistance.  She rates her pain as an 8 out of 10.      Past Medical History:  Diagnosis Date  . Anemia    with pregnancy- on Iron med  . Anxiety   . Depression   . Gallstones   . Hypothyroid    on med- does not rmember name or dosage  . Left ovarian cyst   . Miscarriage   . Morbid obesity with body mass index (BMI) of 45.0 to 49.9 in adult [facility])   . Oral herpes     Patient Active Problem List   Diagnosis Date Noted  . Obesity affecting pregnancy 12/23/2017  . BMI 34.0-34.9,adult 12/23/2017  . History of cesarean delivery 12/23/2017  . History of pre-eclampsia in prior pregnancy, currently pregnant in first trimester 10/23/2017  . Supervision of high risk pregnancy, antepartum 10/23/2017  . Anxiety 01/31/2016  . Depression 09/16/2015  . Tension headache, chronic 09/13/2015  . Right-sided low back pain without sciatica 09/13/2015  . Abnormal glucose level 04/14/2015  . Cannabis abuse 04/14/2015  . Morbid obesity ([place]) 04/03/2015  . Hypothyroidism 04/03/2015  . 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 Breast pain 09/09/2013    Past Surgical History:  Procedure Laterality Date  . CESAREAN SECTION      Prior to Admission medications   Medication Sig Start Date End Date Taking?  Authorizing Provider  aspirin EC 81 MG tablet Take 1 tablet (81 mg total) by mouth daily. Take after 12 weeks for prevention of preeclampssia later in pregnancy 10/28/17   Adrian Prows R, MD  Doxylamine-Pyridoxine 10-10 MG TBEC Take 2 tablets by mouth at bedtime. May take an additional tablet in the morning and in the afternoon. Do not exceed 4 tablets in 24 hours. 01/07/18   Rexene Agent, CNM  loperamide (IMODIUM A-D) 2 MG tablet Take 1 tablet (2 mg total) by mouth 4 (four) times daily as needed for diarrhea or loose stools. Patient not taking: Reported on 12/23/2017 10/28/17   Homero Fellers, MD  ondansetron (ZOFRAN ODT) 4 MG disintegrating tablet Take 1 tablet (4 mg total) by mouth every 6 (six) hours as needed for nausea. 01/07/18   Rexene Agent, CNM  Prenatal Vit-Fe Fumarate-FA (MULTIVITAMIN-PRENATAL) 27-0.8 MG TABS tablet Take 1 tablet by mouth daily at 12 noon.    [provider]  ranitidine (ZANTAC) 150 MG tablet Take 1 tablet (150 mg total) by mouth 2 (two) times daily. 01/07/18   Rexene Agent, CNM  sertraline (ZOLOFT) 50 MG tablet TAKE 1 TABLET BY MOUTH EVERY DAY 01/16/18   Rexene Agent, CNM    Allergies Patient has no known allergies.  Family History  Problem Relation Age of Onset  . COPD Mother   . Asthma Mother   . Hypertension Father  Social History Social History   Tobacco Use  . Smoking status: Never Smoker  . Smokeless tobacco: Never Used  Substance Use Topics  . Alcohol use: No  . Drug use: No    Review of Systems Constitutional: No fever/chills Cardiovascular: Denies chest pain. Respiratory: Denies shortness of breath. Gastrointestinal: No abdominal pain.  No nausea, no vomiting.  Genitourinary: Negative for dysuria.  LMP 11/10/2018 Musculoskeletal: Positive for right low back pain. Skin: Negative for rash. Neurological: Negative for  focal weakness or numbness. ___________________________________________   PHYSICAL  EXAM:  VITAL SIGNS: ED Triage Vitals  Enc Vitals Group     BP 12/10/18 1109 (!) 113/59     Pulse Rate 12/10/18 1109 (!) 106     Resp 12/10/18 1109 16     Temp 12/10/18 1108 98.9 F (37.2 C)     Temp Source 12/10/18 1108 Oral     SpO2 12/10/18 1109 99 %     Weight 12/10/18 1108 228 lb (103.4 kg)     Height 12/10/18 1108 5\' 4"  (1.626 m)     Head Circumference --      Peak Flow --      Pain Score 12/10/18 1108 8     Pain Loc --      Pain Edu? --      Excl. in GC? --     Constitutional: Alert and oriented. Well appearing and in no acute distress. Eyes: Conjunctivae are normal.  Head: Atraumatic. Neck: No stridor.   Cardiovascular: Normal rate, regular rhythm. Grossly normal heart sounds.  Good peripheral circulation. Respiratory: Normal respiratory effort.  No retractions. Lungs CTAB. Gastrointestinal: Soft and nontender. No distention.  Musculoskeletal: On examination of the back there is no gross deformity.  There is tenderness over the right SI joint area.  Good muscle strength bilaterally.  Patient is able to ambulate without any assistance.  Normal gait was noted.  No point tenderness is noted on the lumbar spine. Neurologic:  Normal speech and language. No gross focal neurologic deficits are appreciated. No gait instability. Skin:  Skin is warm, dry and intact. No rash noted. Psychiatric: Mood and affect are normal. Speech and behavior are normal.  ____________________________________________   LABS (all labs ordered are listed, but only abnormal results are displayed)  Labs Reviewed  URINALYSIS, COMPLETE (UACMP) WITH MICROSCOPIC - Abnormal; Notable for the following components:      Result Value   Color, Urine YELLOW (*)    APPearance HAZY (*)    All other components within normal limits  POCT PREGNANCY, URINE - Abnormal; Notable for the following components:   Preg Test, Ur POSITIVE (*)    All other components within normal limits  POC URINE PREG, ED     PROCEDURES  Procedure(s) performed (including Critical Care):  Procedures   ____________________________________________   INITIAL IMPRESSION / ASSESSMENT AND PLAN / ED COURSE  As part of my medical decision making, I reviewed the following data within the electronic MEDICAL RECORD NUMBER Notes from prior ED visits and [place] Controlled Substance Database  33 year old female presents to the ED with complaint of right low back pain for 2 months.  Patient urinalysis did not show a UTI.  She was made aware that she was pregnant and she reports that [facility].  She is encouraged to continue using over-the-counter creams topically and we discussed using a TENS unit in the area.  We also discussed a Landchiropractor.  Patient was cautioned  not to take any anti-inflammatories at this time due to her pregnancy. ____________________________________________   FINAL CLINICAL IMPRESSION(S) / ED DIAGNOSES  Final diagnoses:  Less than [redacted] weeks gestation of pregnancy  Acute right-sided low back pain with right-sided sciatica     ED Discharge Orders    None       Note:  This document was prepared using Dragon voice recognition software and may include unintentional dictation errors.    Tommi RumpsSummers, Myrl Bynum L, PA-C 12/10/18 1225    Miguel AschoffMonks, Sarah L., MD 12/10/18 2006

## 2018-12-22 ENCOUNTER — Encounter: Payer: Medicare Other | Admitting: Maternal Newborn

## 2019-01-23 ENCOUNTER — Other Ambulatory Visit: Payer: Self-pay | Admitting: Maternal Newborn

## 2019-01-23 DIAGNOSIS — O0990 Supervision of high risk pregnancy, unspecified, unspecified trimester: Secondary | ICD-10-CM

## 2019-01-23 DIAGNOSIS — O219 Vomiting of pregnancy, unspecified: Secondary | ICD-10-CM

## 2019-02-24 ENCOUNTER — Other Ambulatory Visit: Payer: Self-pay
